# Patient Record
Sex: Female | Born: 1980
Health system: Southern US, Community
[De-identification: ages and names within clinical notes are randomized; demographics above are authoritative.]

## PROBLEM LIST (undated history)

## (undated) DIAGNOSIS — T7840XA Allergy, unspecified, initial encounter: Secondary | ICD-10-CM

## (undated) DIAGNOSIS — N809 Endometriosis, unspecified: Secondary | ICD-10-CM

## (undated) DIAGNOSIS — I10 Essential (primary) hypertension: Secondary | ICD-10-CM

## (undated) DIAGNOSIS — N80129 Deep endometriosis of ovary, unspecified ovary: Secondary | ICD-10-CM

## (undated) DIAGNOSIS — L0390 Cellulitis, unspecified: Secondary | ICD-10-CM

## (undated) HISTORY — DX: Essential (primary) hypertension: I10

## (undated) HISTORY — DX: Allergy, unspecified, initial encounter: T78.40XA

## (undated) HISTORY — DX: Endometriosis, unspecified: N80.9

## (undated) HISTORY — PX: WISDOM TOOTH EXTRACTION: SHX21

## (undated) HISTORY — DX: Deep endometriosis of ovary, unspecified ovary: N80.129

## (undated) HISTORY — DX: Cellulitis, unspecified: L03.90

---

## 2011-01-09 LAB — US OB COMP + 14 WK

## 2013-10-11 LAB — HM PAP SMEAR: HM PAP: NEGATIVE

## 2014-10-16 ENCOUNTER — Ambulatory Visit (INDEPENDENT_AMBULATORY_CARE_PROVIDER_SITE_OTHER): Payer: BC Managed Care – PPO | Admitting: Obstetrics & Gynecology

## 2014-10-16 ENCOUNTER — Other Ambulatory Visit (HOSPITAL_COMMUNITY)
Admission: RE | Admit: 2014-10-16 | Discharge: 2014-10-16 | Disposition: A | Discharge status: Home / Self Care | Payer: BC Managed Care – PPO | Source: Ambulatory Visit | Attending: Obstetrics & Gynecology | Admitting: Obstetrics & Gynecology

## 2014-10-16 ENCOUNTER — Encounter: Payer: Self-pay | Admitting: *Deleted

## 2014-10-16 ENCOUNTER — Encounter: Payer: Self-pay | Admitting: Obstetrics & Gynecology

## 2014-10-16 VITALS — BP 110/80 | Ht 69.0 in | Wt 248.0 lb

## 2014-10-16 DIAGNOSIS — Z01419 Encounter for gynecological examination (general) (routine) without abnormal findings: Secondary | ICD-10-CM | POA: Diagnosis present

## 2014-10-16 DIAGNOSIS — R87610 Atypical squamous cells of undetermined significance on cytologic smear of cervix (ASC-US): Secondary | ICD-10-CM

## 2014-10-16 DIAGNOSIS — E669 Obesity, unspecified: Secondary | ICD-10-CM | POA: Insufficient documentation

## 2014-10-16 DIAGNOSIS — Z1151 Encounter for screening for human papillomavirus (HPV): Secondary | ICD-10-CM | POA: Insufficient documentation

## 2014-10-16 DIAGNOSIS — R87619 Unspecified abnormal cytological findings in specimens from cervix uteri: Secondary | ICD-10-CM | POA: Insufficient documentation

## 2014-10-16 NOTE — Progress Notes (Signed)
Patient ID: Sonya Chapman, female   DOB: 09/17/1981, 33 y.o.   MRN: [id number] Subjective:     Sonya Chapman is a 33 y.o. female here for a routine exam.  Patient's last menstrual period was 09/15/2014. No obstetric history on file. Birth Control Method:  None, does not want or need Menstrual Calendar(currently): regular  Current complaints: none.   Current acute medical issues:  none   Recent Gynecologic History Patient's last menstrual period was 09/15/2014. Last Pap: 2014,  normal Last mammogram: ,    History reviewed. No pertinent past medical history.  Past Surgical History  Procedure Laterality Date  . Wisdom tooth extraction      OB History    No data available      History   Social History  . Marital Status: Married    Spouse Name: N/A    Number of Children: N/A  . Years of Education: N/A   Social History Main Topics  . Smoking status: Never Smoker   . Smokeless tobacco: None  . Alcohol Use: None  . Drug Use: None  . Sexual Activity: Yes   Other Topics Concern  . None   Social History Narrative  . None    Family History  Problem Relation Age of Onset  . Hypertension Mother     Current outpatient prescriptions: Multiple Vitamin (MULTIVITAMIN) tablet, Take 1 tablet by mouth daily., Disp: , Rfl:   Review of Systems  Review of Systems  Constitutional: Negative for fever, chills, weight loss, malaise/fatigue and diaphoresis.  HENT: Negative for hearing loss, ear pain, nosebleeds, congestion, sore throat, neck pain, tinnitus and ear discharge.   Eyes: Negative for blurred vision, double vision, photophobia, pain, discharge and redness.  Respiratory: Negative for cough, hemoptysis, sputum production, shortness of breath, wheezing and stridor.   Cardiovascular: Negative for chest pain, palpitations, orthopnea, claudication, leg swelling and PND.  Gastrointestinal: negative for abdominal pain. Negative for heartburn, nausea, vomiting, diarrhea,  constipation, blood in stool and melena.  Genitourinary: Negative for dysuria, urgency, frequency, hematuria and flank pain.  Musculoskeletal: Negative for myalgias, back pain, joint pain and falls.  Skin: Negative for itching and rash.  Neurological: Negative for dizziness, tingling, tremors, sensory change, speech change, focal weakness, seizures, loss of consciousness, weakness and headaches.  Endo/Heme/Allergies: Negative for environmental allergies and polydipsia. Does not bruise/bleed easily.  Psychiatric/Behavioral: Negative for depression, suicidal ideas, hallucinations, memory loss and substance abuse. The patient is not nervous/anxious and does not have insomnia.        Objective:  Blood pressure 110/80, height 5\' 9"  (1.753 m), weight 248 lb (112.492 kg), last menstrual period 09/15/2014.   Physical Exam  Vitals reviewed. Constitutional: She is oriented to person, place, and time. She appears well-developed and well-nourished.  HENT:  Head: Normocephalic and atraumatic.        Right Ear: External ear normal.  Left Ear: External ear normal.  Nose: Nose normal.  Mouth/Throat: Oropharynx is clear and moist.  Eyes: Conjunctivae and EOM are normal. Pupils are equal, round, and reactive to light. Right eye exhibits no discharge. Left eye exhibits no discharge. No scleral icterus.  Neck: Normal range of motion. Neck supple. No tracheal deviation present. No thyromegaly present.  Cardiovascular: Normal rate, regular rhythm, normal heart sounds and intact distal pulses.  Exam reveals no gallop and no friction rub.   No murmur heard. Respiratory: Effort normal and breath sounds normal. No respiratory distress. She has no wheezes. She has no rales. She exhibits no tenderness.  GI: Soft. Bowel sounds are normal. She exhibits no distension and no mass. There is no tenderness. There is no rebound and no guarding.  Genitourinary:  Breasts no masses skin changes or nipple changes bilaterally       Vulva is normal without lesions Vagina is pink moist without discharge Cervix normal in appearance and pap is done Uterus is normal size shape and contour Adnexa is negative with normal sized ovaries   Musculoskeletal: Normal range of motion. She exhibits no edema and no tenderness.  Neurological: She is alert and oriented to person, place, and time. She has normal reflexes. She displays normal reflexes. No cranial nerve deficit. She exhibits normal muscle tone. Coordination normal.  Skin: Skin is warm and dry. No rash noted. No erythema. No pallor.  Psychiatric: She has a normal mood and affect. Her behavior is normal. Judgment and thought content normal.       Assessment:    Healthy female exam.    Plan:    Contraception: none. Follow up in: 1 year.

## 2014-10-17 LAB — CYTOLOGY - PAP

## 2015-09-25 ENCOUNTER — Ambulatory Visit (INDEPENDENT_AMBULATORY_CARE_PROVIDER_SITE_OTHER): Payer: BLUE CROSS/BLUE SHIELD | Admitting: Obstetrics & Gynecology

## 2015-09-25 ENCOUNTER — Encounter: Payer: Self-pay | Admitting: Obstetrics & Gynecology

## 2015-09-25 VITALS — BP 150/90 | HR 76 | Ht 69.0 in | Wt 247.0 lb

## 2015-09-25 DIAGNOSIS — N808 Other endometriosis: Secondary | ICD-10-CM

## 2015-09-25 DIAGNOSIS — N80C2 Endometriosis of the umbilicus: Secondary | ICD-10-CM

## 2015-10-11 ENCOUNTER — Telehealth: Payer: Self-pay | Admitting: *Deleted

## 2015-10-11 NOTE — Telephone Encounter (Signed)
Pt states having CT done on 10/15/2015 and Dr. Despina HiddenEure told her it would be without contrast. Pt states the hospital is calling her and telling her to pick up the contrast prior to the CT. Please advise.

## 2015-10-12 NOTE — Telephone Encounter (Signed)
I called pt myself and told her she does not need the oral contrast  No need to call pt back

## 2015-10-15 ENCOUNTER — Other Ambulatory Visit: Payer: Self-pay | Admitting: Obstetrics & Gynecology

## 2015-10-15 ENCOUNTER — Ambulatory Visit (HOSPITAL_COMMUNITY)
Admission: RE | Admit: 2015-10-15 | Discharge: 2015-10-15 | Disposition: A | Discharge status: Home / Self Care | Payer: BLUE CROSS/BLUE SHIELD | Source: Ambulatory Visit | Attending: Obstetrics & Gynecology | Admitting: Obstetrics & Gynecology

## 2015-10-15 DIAGNOSIS — R935 Abnormal findings on diagnostic imaging of other abdominal regions, including retroperitoneum: Secondary | ICD-10-CM | POA: Insufficient documentation

## 2015-10-15 DIAGNOSIS — N808 Other endometriosis: Secondary | ICD-10-CM

## 2015-10-15 DIAGNOSIS — R1905 Periumbilic swelling, mass or lump: Secondary | ICD-10-CM | POA: Diagnosis not present

## 2015-10-15 DIAGNOSIS — R1033 Periumbilical pain: Secondary | ICD-10-CM | POA: Diagnosis present

## 2015-10-15 DIAGNOSIS — N80C2 Endometriosis of the umbilicus: Secondary | ICD-10-CM

## 2015-10-15 MED ORDER — IOHEXOL 300 MG/ML  SOLN: 100.0000 mL | Freq: Once | INTRAMUSCULAR | Status: DC | PRN

## 2015-10-16 ENCOUNTER — Encounter: Payer: Self-pay | Admitting: Obstetrics & Gynecology

## 2015-10-21 NOTE — Progress Notes (Signed)
Patient ID: Sonya Chapman, female   DOB: 08/27/1981, 34 y.o.   MRN: [id number]      Chief Complaint  Patient presents with  . gyn visit    c/c pulling/ burning on left side of belly button.    Blood pressure 150/90, pulse 76, height 5\' 9"  (1.753 m), weight 247 lb (112.038 kg), last menstrual period 09/16/2015.  34 y.o. G1P0 Patient's last menstrual period was 09/16/2015.   Subjective Pt has increasing severity of perimenstrual pain just to the left of the umbilicus No Caesraean section history No NVD No fevers chills Definitively associated with menses by her history  She spoke of it last year but it was mild and just starting to occur Last 6 months much worse, heating pad helps  Objective Indurated tissue 2x2 cm to left of umbilicus  Abdomen also negative  Pertinent ROS No burning with urination, frequency or urgency No nausea, vomiting or diarrhea Nor fever chills or other constitutional symptoms   Labs or studies  Impression Diagnoses this Encounter::   ICD-9-CM ICD-10-CM   1. Endometriosis of umbilicus 617.8 N80.8 CANCELED: CT Abdomen W Contrast    Established relevant diagnosis(es):   Plan/Recommendations: No orders of the defined types were placed in this encounter.    Labs or Scans Ordered: No orders of the defined types were placed in this encounter.    Management:: CT scan to be done at time of menses  Follow up Based on CT      All questions were answered.

## 2015-11-01 ENCOUNTER — Other Ambulatory Visit: Payer: Self-pay | Admitting: Obstetrics & Gynecology

## 2015-11-02 ENCOUNTER — Ambulatory Visit (INDEPENDENT_AMBULATORY_CARE_PROVIDER_SITE_OTHER): Payer: BLUE CROSS/BLUE SHIELD | Admitting: Obstetrics & Gynecology

## 2015-11-02 ENCOUNTER — Other Ambulatory Visit (HOSPITAL_COMMUNITY)
Admission: RE | Admit: 2015-11-02 | Discharge: 2015-11-02 | Disposition: A | Discharge status: Home / Self Care | Payer: BLUE CROSS/BLUE SHIELD | Source: Ambulatory Visit | Attending: Obstetrics & Gynecology | Admitting: Obstetrics & Gynecology

## 2015-11-02 ENCOUNTER — Encounter: Payer: Self-pay | Admitting: Obstetrics & Gynecology

## 2015-11-02 VITALS — BP 130/80 | HR 76 | Ht 69.0 in | Wt 250.0 lb

## 2015-11-02 DIAGNOSIS — Z01419 Encounter for gynecological examination (general) (routine) without abnormal findings: Secondary | ICD-10-CM | POA: Diagnosis not present

## 2015-11-02 MED ORDER — MEGESTROL ACETATE 40 MG PO TABS: 40.0000 mg | ORAL_TABLET | Freq: Every day | ORAL | Status: DC

## 2015-11-02 NOTE — Progress Notes (Signed)
Patient ID: Sonya Chapman, female   DOB: 05/05/1981, 34 y.o.   MRN: [id number]    Subjective:     Sonya Chapman Chapman is a 34 y.o. female here for a routine exam.  Patient's last menstrual period was 10/14/2015. G1P0 Birth Control Method:  none Menstrual Calendar(currently): regular monthly  Current complaints: endometriosis of umbilicus.   Current acute medical issues:     Recent Gynecologic History Patient's last menstrual period was 10/14/2015. Last Pap: 2015,  normal Last mammogram: ,    History reviewed. No pertinent past medical history.  Past Surgical History  Procedure Laterality Date  . Wisdom tooth extraction    . Cesarean section      OB History    Gravida Para Term Preterm AB TAB SAB Ectopic Multiple Living   1               Obstetric Comments   IVF pregnancy,incision abscess      Social History   Social History  . Marital Status: Married    Spouse Name: N/A  . Number of Children: N/A  . Years of Education: N/A   Social History Main Topics  . Smoking status: Never Smoker   . Smokeless tobacco: None  . Alcohol Use: None  . Drug Use: None  . Sexual Activity: Yes   Other Topics Concern  . None   Social History Narrative    Family History  Problem Relation Age of Onset  . Hypertension Mother   . Thyroid disease Mother      Current outpatient prescriptions:  Marland Kitchen.  Multiple Vitamin (MULTIVITAMIN) tablet, Take 1 tablet by mouth daily., Disp: , Rfl:   Review of Systems  Review of Systems  Constitutional: Negative for fever, chills, weight loss, malaise/fatigue and diaphoresis.  HENT: Negative for hearing loss, ear pain, nosebleeds, congestion, sore throat, neck pain, tinnitus and ear discharge.   Eyes: Negative for blurred vision, double vision, photophobia, pain, discharge and redness.  Respiratory: Negative for cough, hemoptysis, sputum production, shortness of breath, wheezing and stridor.   Cardiovascular: Negative for chest pain, palpitations,  orthopnea, claudication, leg swelling and PND.  Gastrointestinal: negative for abdominal pain. Negative for heartburn, nausea, vomiting, diarrhea, constipation, blood in stool and melena.  Genitourinary: Negative for dysuria, urgency, frequency, hematuria and flank pain.  Musculoskeletal: Negative for myalgias, back pain, joint pain and falls.  Skin: Negative for itching and rash.  Neurological: Negative for dizziness, tingling, tremors, sensory change, speech change, focal weakness, seizures, loss of consciousness, weakness and headaches.  Endo/Heme/Allergies: Negative for environmental allergies and polydipsia. Does not bruise/bleed easily.  Psychiatric/Behavioral: Negative for depression, suicidal ideas, hallucinations, memory loss and substance abuse. The patient is not nervous/anxious and does not have insomnia.        Objective:  Blood pressure 130/80, pulse 76, height 5\' 9"  (1.753 m), weight 250 lb (113.399 kg), last menstrual period 10/14/2015.   Physical Exam  Vitals reviewed. Constitutional: She is oriented to person, place, and time. She appears well-developed and well-nourished.  HENT:  Head: Normocephalic and atraumatic.        Right Ear: External ear normal.  Left Ear: External ear normal.  Nose: Nose normal.  Mouth/Throat: Oropharynx is clear and moist.  Eyes: Conjunctivae and EOM are normal. Pupils are equal, round, and reactive to light. Right eye exhibits no discharge. Left eye exhibits no discharge. No scleral icterus.  Neck: Normal range of motion. Neck supple. No tracheal deviation present. No thyromegaly present.  Cardiovascular: Normal rate, regular rhythm,  normal heart sounds and intact distal pulses.  Exam reveals no gallop and no friction rub.   No murmur heard. Respiratory: Effort normal and breath sounds normal. No respiratory distress. She has no wheezes. She has no rales. She exhibits no tenderness.  GI: Soft. Bowel sounds are normal. She exhibits no  distension and no mass. There is no tenderness. There is no rebound and no guarding.   Area to left of umbilicus is tender and indurated feeling Genitourinary:  Breasts no masses skin changes or nipple changes bilaterally      Vulva is normal without lesions Vagina is pink moist without discharge Cervix normal in appearance and pap is done Uterus is normal size shape and contour Adnexa is negative with normal sized ovaries   Musculoskeletal: Normal range of motion. She exhibits no edema and no tenderness.  Neurological: She is alert and oriented to person, place, and time. She has normal reflexes. She displays normal reflexes. No cranial nerve deficit. She exhibits normal muscle tone. Coordination normal.  Skin: Skin is warm and dry. No rash noted. No erythema. No pallor.  Psychiatric: She has a normal mood and affect. Her behavior is normal. Judgment and thought content normal.       Assessment:    Healthy female exam.    Plan:    pre op in 6 weeks Megestrol 40 mg daily to try to tsuppress menses

## 2015-11-08 LAB — CYTOLOGY - PAP

## 2015-12-14 ENCOUNTER — Encounter: Payer: Self-pay | Admitting: Obstetrics & Gynecology

## 2015-12-14 ENCOUNTER — Encounter: Payer: BLUE CROSS/BLUE SHIELD | Admitting: Obstetrics & Gynecology

## 2015-12-14 ENCOUNTER — Ambulatory Visit (INDEPENDENT_AMBULATORY_CARE_PROVIDER_SITE_OTHER): Payer: BLUE CROSS/BLUE SHIELD | Admitting: Obstetrics & Gynecology

## 2015-12-14 VITALS — BP 128/90 | HR 70 | Wt 248.0 lb

## 2015-12-14 DIAGNOSIS — N808 Other endometriosis: Secondary | ICD-10-CM | POA: Diagnosis not present

## 2015-12-14 DIAGNOSIS — N80C2 Endometriosis of the umbilicus: Secondary | ICD-10-CM

## 2015-12-14 NOTE — Progress Notes (Signed)
Patient ID: Sonya Chapman, female   DOB: Oct 09, 1981, 35 y.o.   MRN: [id number] Preoperative History and Physical  Sonya Chapman is a 35 y.o. G1P0 with Patient's last menstrual period was 10/14/2015 (approximate). admitted for a resection of peri umbilical endometrioma.  Pt began to have episodic pain about 16 months or so ago, which has progressively worsened The pain occurs around her menses and a tender mass also arises at that time CT scan confirms exam My impression was this is an unusual presenttation of an endometrioma, pt has not had a laparoscopy I placed her on megestrol and pt had a definitive positive response with near resolution of pain and mass  As a result will resect the entire area, CT reveals confined to abdominal wall  PMH: History reviewed. No pertinent past medical history.  PSH:  Past Surgical History  Procedure Laterality Date  . Wisdom tooth extraction    . Cesarean section      POb/GynH:  OB History    Gravida Para Term Preterm AB TAB SAB Ectopic Multiple Living   1               Obstetric Comments   IVF pregnancy,incision abscess      SH:  Social History  Substance Use Topics  . Smoking status: Never Smoker   . Smokeless tobacco: None  . Alcohol Use: 0.0 oz/week    0 Standard drinks or equivalent per week     Comment: occassional    FH:  Family History  Problem Relation Age of Onset  . Hypertension Mother   . Thyroid disease Mother      Allergies:  Allergies  Allergen Reactions  . Penicillins Rash    Has patient had a PCN reaction causing immediate rash, facial/tongue/throat swelling, SOB or lightheadedness with hypotension: Yes Has patient had a PCN reaction causing severe rash involving mucus membranes or skin necrosis: No Has patient had a PCN reaction that required hospitalization No Has patient had a PCN reaction occurring within the last  10 years: No If all of the above answers are "NO", then may proceed with Cephalosporin use.     Medications:  Current facility-administered medications:  . bupivacaine liposome (EXPAREL) 1.3 % injection 266 mg, 20 mL, Infiltration, Once, Lazaro Arms, MD . gentamicin (GARAMYCIN) 420 mg, clindamycin (CLEOCIN) 900 mg in dextrose 5 % 100 mL IVPB, , Intravenous, On Call to OR, Lazaro Arms, MD . ketorolac (TORADOL) 30 MG/ML injection 30 mg, 30 mg, Intravenous, Once, Lazaro Arms, MD  Review of Systems:   Review of Systems  Constitutional: Negative for fever, chills, weight loss, malaise/fatigue and diaphoresis.  HENT: Negative for hearing loss, ear pain, nosebleeds, congestion, sore throat, neck pain, tinnitus and ear discharge.  Eyes: Negative for blurred vision, double vision, photophobia, pain, discharge and redness.  Respiratory: Negative for cough, hemoptysis, sputum production, shortness of breath, wheezing and stridor.  Cardiovascular: Negative for chest pain, palpitations, orthopnea, claudication, leg swelling and PND.  Gastrointestinal: Positive for abdominal pain. Negative for heartburn, nausea, vomiting, diarrhea, constipation, blood in stool and melena.  Genitourinary: Negative for dysuria, urgency, frequency, hematuria and flank pain.  Musculoskeletal: Negative for myalgias, back pain, joint pain and falls.  Skin: Negative for itching and rash.  Neurological: Negative for dizziness, tingling, tremors, sensory change, speech change, focal weakness, seizures, loss of consciousness, weakness and headaches.  Endo/Heme/Allergies: Negative for environmental allergies and polydipsia. Does not bruise/bleed easily.  Psychiatric/Behavioral: Negative for depression, suicidal ideas, hallucinations, memory  loss and substance abuse. The patient is not nervous/anxious and does not have insomnia.     PHYSICAL EXAM:  Blood pressure 136/79, pulse 104, temperature 99 F (37.2  C), temperature source Oral, resp. rate 24, height 5\' 9"  (1.753 m), weight 249 lb (112.946 kg), last menstrual period 10/14/2015, SpO2 100 %.   Vitals reviewed. Constitutional: She is oriented to person, place, and time. She appears well-developed and well-nourished.  HENT:  Head: Normocephalic and atraumatic.  Right Ear: External ear normal.  Left Ear: External ear normal.  Nose: Nose normal.  Mouth/Throat: Oropharynx is clear and moist.  Eyes: Conjunctivae and EOM are normal. Pupils are equal, round, and reactive to light. Right eye exhibits no discharge. Left eye exhibits no discharge. No scleral icterus.  Neck: Normal range of motion. Neck supple. No tracheal deviation present. No thyromegaly present.  Cardiovascular: Normal rate, regular rhythm, normal heart sounds and intact distal pulses. Exam reveals no gallop and no friction rub.  No murmur heard. Respiratory: Effort normal and breath sounds normal. No respiratory distress. She has no wheezes. She has no rales. She exhibits no tenderness.  GI: Soft. Bowel sounds are normal. She exhibits no distension and no mass. There is tenderness around the umbilicus much less as compared to exam say in November prior to her megestrol therapy. There is no rebound and no guarding.  Genitourinary:   Vulva is normal without lesions Vagina is pink moist without discharge Cervix normal in appearance and pap is normal Uterus is normal size, contour, position, consistency, mobility, non-tender Adnexa is negative with normal sized ovaries by sonogram  Musculoskeletal: Normal range of motion. She exhibits no edema and no tenderness.  Neurological: She is alert and oriented to person, place, and time. She has normal reflexes. She displays normal reflexes. No cranial nerve deficit. She exhibits normal muscle tone. Coordination normal.  Skin: Skin is warm and dry. No rash noted. No erythema. No pallor.  Psychiatric: She has a normal mood and  affect. Her behavior is normal. Judgment and thought content normal.    Labs: Results for orders placed or performed during the hospital encounter of 12/21/15 (from the past 336 hour(s))  CBC   Collection Time: 12/21/15 9:15 AM  Result Value Ref Range   WBC 6.9 4.0 - 10.5 K/uL   RBC 5.43 (H) 3.87 - 5.11 MIL/uL   Hemoglobin 15.8 (H) 12.0 - 15.0 g/dL   HCT 29.5 (H) 62.1 - 30.8 %   MCV 87.1 78.0 - 100.0 fL   MCH 29.1 26.0 - 34.0 pg   MCHC 33.4 30.0 - 36.0 g/dL   RDW 65.7 84.6 - 96.2 %   Platelets 189 150 - 400 K/uL  Comprehensive metabolic panel   Collection Time: 12/21/15 9:15 AM  Result Value Ref Range   Sodium 141 135 - 145 mmol/L   Potassium 4.3 3.5 - 5.1 mmol/L   Chloride 109 101 - 111 mmol/L   CO2 22 22 - 32 mmol/L   Glucose, Bld 88 65 - 99 mg/dL   BUN 13 6 - 20 mg/dL   Creatinine, Ser 9.52 0.44 - 1.00 mg/dL   Calcium 9.5 8.9 - 84.1 mg/dL   Total Protein 7.8 6.5 - 8.1 g/dL   Albumin 4.7 3.5 - 5.0 g/dL   AST 22 15 - 41 U/L   ALT 11 (L) 14 - 54 U/L   Alkaline Phosphatase 86 38 - 126 U/L   Total Bilirubin 0.6 0.3 - 1.2 mg/dL   GFR calc  non Af Amer >60 >60 mL/min   GFR calc Af Amer >60 >60 mL/min   Anion gap 10 5 - 15  hCG, quantitative, pregnancy   Collection Time: 12/21/15 9:15 AM  Result Value Ref Range   hCG, Beta Chain, Quant, S <1 <5 mIU/mL  Urinalysis, Routine w reflex microscopic (not at [facility])   Collection Time: 12/21/15 9:15 AM  Result Value Ref Range   Color, Urine YELLOW YELLOW   APPearance CLEAR CLEAR   Specific Gravity, Urine <1.005 (L) 1.005 - 1.030   pH 5.5 5.0 - 8.0   Glucose, UA NEGATIVE NEGATIVE mg/dL   Hgb urine dipstick LARGE (A) NEGATIVE   Bilirubin Urine NEGATIVE NEGATIVE   Ketones, ur NEGATIVE NEGATIVE mg/dL   Protein, ur NEGATIVE NEGATIVE mg/dL   Nitrite NEGATIVE  NEGATIVE   Leukocytes, UA NEGATIVE NEGATIVE  Urine microscopic-add on   Collection Time: 12/21/15 9:15 AM  Result Value Ref Range   Squamous Epithelial / LPF NONE SEEN NONE SEEN   WBC, UA NONE SEEN 0 - 5 WBC/hpf   RBC / HPF TOO NUMEROUS TO COUNT 0 - 5 RBC/hpf   Bacteria, UA RARE (A) NONE SEEN    EKG: No orders found for this or any previous visit.  Imaging Studies:  Imaging Results    No results found.      Assessment: Peri umbilical endometrioma  Plan: Resection of peri umbilical endometrioma       Santoria Chason H 12/14/2015 10:08 AM

## 2015-12-19 NOTE — Patient Instructions (Signed)
    Sonya Chapman  12/19/2015     @   Your procedure is scheduled on 12/26/2015.  Report to [facility] at 7:00 A.M.  Call this number if you have problems the morning of surgery:  [phone]   Remember:  Do not eat food or drink liquids after midnight.  Take these medicines the morning of surgery with A SIP OF WATER Megace   Do not wear jewelry, make-up or nail polish.  Do not wear lotions, powders, or perfumes.  You may wear deodorant.  Do not shave 48 hours prior to surgery.  Men may shave face and neck.  Do not bring valuables to the hospital.  [facility] is not responsible for any belongings or valuables.  Contacts, dentures or bridgework may not be worn into surgery.  Leave your suitcase in the car.  After surgery it may be brought to your room.  For patients admitted to the hospital, discharge time will be determined by your treatment team.  Patients discharged the day of surgery will not be allowed to drive home.    Please read over the following fact sheets that you were given. Surgical Site Infection Prevention and Anesthesia Post-op Instructions     PATIENT INSTRUCTIONS POST-ANESTHESIA  IMMEDIATELY FOLLOWING SURGERY:  Do not drive or operate machinery for the first twenty four hours after surgery.  Do not make any important decisions for twenty four hours after surgery or while taking narcotic pain medications or sedatives.  If you develop intractable nausea and vomiting or a severe headache please notify your doctor immediately.  FOLLOW-UP:  Please make an appointment with your surgeon as instructed. You do not need to follow up with anesthesia unless specifically instructed to do so.  WOUND CARE INSTRUCTIONS (if applicable):  Keep a dry clean dressing on the anesthesia/puncture wound site if there is drainage.  Once the wound has quit draining you may leave it open to air.  Generally you should leave the bandage intact for twenty four hours  unless there is drainage.  If the epidural site drains for more than 36-48 hours please call the anesthesia department.  QUESTIONS?:  Please feel free to call your physician or the hospital operator if you have any questions, and they will be happy to assist you.

## 2015-12-21 ENCOUNTER — Encounter (HOSPITAL_COMMUNITY): Payer: Self-pay

## 2015-12-21 ENCOUNTER — Encounter (HOSPITAL_COMMUNITY)
Admission: RE | Admit: 2015-12-21 | Discharge: 2015-12-21 | Disposition: A | Discharge status: Home / Self Care | Payer: BLUE CROSS/BLUE SHIELD | Source: Ambulatory Visit | Attending: Obstetrics & Gynecology | Admitting: Obstetrics & Gynecology

## 2015-12-21 DIAGNOSIS — Z79899 Other long term (current) drug therapy: Secondary | ICD-10-CM | POA: Insufficient documentation

## 2015-12-21 DIAGNOSIS — Z01812 Encounter for preprocedural laboratory examination: Secondary | ICD-10-CM | POA: Insufficient documentation

## 2015-12-21 DIAGNOSIS — Z9889 Other specified postprocedural states: Secondary | ICD-10-CM | POA: Insufficient documentation

## 2015-12-21 DIAGNOSIS — Z880 Allergy status to penicillin: Secondary | ICD-10-CM | POA: Insufficient documentation

## 2015-12-21 DIAGNOSIS — N808 Other endometriosis: Secondary | ICD-10-CM | POA: Diagnosis not present

## 2015-12-21 LAB — COMPREHENSIVE METABOLIC PANEL
ALT: 11 U/L — ABNORMAL LOW (ref 14–54)
AST: 22 U/L (ref 15–41)
Albumin: 4.7 g/dL (ref 3.5–5.0)
Alkaline Phosphatase: 86 U/L (ref 38–126)
Anion gap: 10 (ref 5–15)
BILIRUBIN TOTAL: 0.6 mg/dL (ref 0.3–1.2)
BUN: 13 mg/dL (ref 6–20)
CHLORIDE: 109 mmol/L (ref 101–111)
CO2: 22 mmol/L (ref 22–32)
CREATININE: 0.86 mg/dL (ref 0.44–1.00)
Calcium: 9.5 mg/dL (ref 8.9–10.3)
Glucose, Bld: 88 mg/dL (ref 65–99)
POTASSIUM: 4.3 mmol/L (ref 3.5–5.1)
Sodium: 141 mmol/L (ref 135–145)
TOTAL PROTEIN: 7.8 g/dL (ref 6.5–8.1)

## 2015-12-21 LAB — CBC
HCT: 47.3 % — ABNORMAL HIGH (ref 36.0–46.0)
Hemoglobin: 15.8 g/dL — ABNORMAL HIGH (ref 12.0–15.0)
MCH: 29.1 pg (ref 26.0–34.0)
MCHC: 33.4 g/dL (ref 30.0–36.0)
MCV: 87.1 fL (ref 78.0–100.0)
PLATELETS: 189 10*3/uL (ref 150–400)
RBC: 5.43 MIL/uL — ABNORMAL HIGH (ref 3.87–5.11)
RDW: 13.4 % (ref 11.5–15.5)
WBC: 6.9 10*3/uL (ref 4.0–10.5)

## 2015-12-21 LAB — URINALYSIS, ROUTINE W REFLEX MICROSCOPIC
Bilirubin Urine: NEGATIVE
GLUCOSE, UA: NEGATIVE mg/dL
Ketones, ur: NEGATIVE mg/dL
LEUKOCYTES UA: NEGATIVE
Nitrite: NEGATIVE
PH: 5.5 (ref 5.0–8.0)
Protein, ur: NEGATIVE mg/dL
Specific Gravity, Urine: <1.005 — ABNORMAL LOW (ref 1.005–1.030)

## 2015-12-21 LAB — URINE MICROSCOPIC-ADD ON
SQUAMOUS EPITHELIAL / LPF: NONE SEEN
WBC UA: NONE SEEN WBC/hpf (ref 0–5)

## 2015-12-21 LAB — HCG, QUANTITATIVE, PREGNANCY

## 2015-12-26 ENCOUNTER — Ambulatory Visit (HOSPITAL_COMMUNITY): Payer: BLUE CROSS/BLUE SHIELD | Admitting: Anesthesiology

## 2015-12-26 ENCOUNTER — Ambulatory Visit (HOSPITAL_COMMUNITY)
Admission: RE | Admit: 2015-12-26 | Discharge: 2015-12-26 | Disposition: A | Discharge status: Home / Self Care | Payer: BLUE CROSS/BLUE SHIELD | Source: Ambulatory Visit | Attending: Obstetrics & Gynecology | Admitting: Obstetrics & Gynecology

## 2015-12-26 ENCOUNTER — Encounter (HOSPITAL_COMMUNITY)
Admission: RE | Disposition: A | Discharge status: Home / Self Care | Payer: Self-pay | Source: Ambulatory Visit | Attending: Obstetrics & Gynecology

## 2015-12-26 ENCOUNTER — Encounter (HOSPITAL_COMMUNITY): Payer: Self-pay | Admitting: *Deleted

## 2015-12-26 DIAGNOSIS — Z880 Allergy status to penicillin: Secondary | ICD-10-CM | POA: Diagnosis not present

## 2015-12-26 DIAGNOSIS — N808 Other endometriosis: Secondary | ICD-10-CM | POA: Insufficient documentation

## 2015-12-26 DIAGNOSIS — N809 Endometriosis, unspecified: Secondary | ICD-10-CM | POA: Diagnosis not present

## 2015-12-26 HISTORY — PX: EXCISION OF ENDOMETRIOMA: SHX6473

## 2015-12-26 SURGERY — EXCISION, ENDOMETRIOMA
Anesthesia: General

## 2015-12-26 MED ORDER — BUPIVACAINE LIPOSOME 1.3 % IJ SUSP
INTRAMUSCULAR | Status: AC
Start: 2015-12-26 — End: 2015-12-26
  Filled 2015-12-26: qty 20

## 2015-12-26 MED ORDER — PROPOFOL 10 MG/ML IV BOLUS
INTRAVENOUS | Status: DC | PRN
  Administered 2015-12-26: 160 mg via INTRAVENOUS

## 2015-12-26 MED ORDER — GLYCOPYRROLATE 0.2 MG/ML IJ SOLN
INTRAMUSCULAR | Status: AC
Start: 2015-12-26 — End: 2015-12-26
  Filled 2015-12-26: qty 2

## 2015-12-26 MED ORDER — FENTANYL CITRATE (PF) 250 MCG/5ML IJ SOLN
INTRAMUSCULAR | Status: AC
Start: 2015-12-26 — End: 2015-12-26
  Filled 2015-12-26: qty 5

## 2015-12-26 MED ORDER — NEOSTIGMINE METHYLSULFATE 10 MG/10ML IV SOLN
INTRAVENOUS | Status: DC | PRN
  Administered 2015-12-26: 3 mg via INTRAVENOUS

## 2015-12-26 MED ORDER — KETOROLAC TROMETHAMINE 30 MG/ML IJ SOLN
30.0000 mg | Freq: Once | INTRAMUSCULAR | Status: AC
  Administered 2015-12-26: 30 mg via INTRAVENOUS
  Filled 2015-12-26: qty 1

## 2015-12-26 MED ORDER — 0.9 % SODIUM CHLORIDE (POUR BTL) OPTIME
TOPICAL | Status: DC | PRN
  Administered 2015-12-26: 1000 mL

## 2015-12-26 MED ORDER — ONDANSETRON HCL 4 MG/2ML IJ SOLN
INTRAMUSCULAR | Status: AC
  Filled 2015-12-26: qty 2

## 2015-12-26 MED ORDER — KETOROLAC TROMETHAMINE 10 MG PO TABS: 10.0000 mg | ORAL_TABLET | Freq: Three times a day (TID) | ORAL | Status: DC | PRN

## 2015-12-26 MED ORDER — LIDOCAINE HCL 1 % IJ SOLN
INTRAMUSCULAR | Status: DC | PRN
  Administered 2015-12-26: 40 mg via INTRADERMAL

## 2015-12-26 MED ORDER — FENTANYL CITRATE (PF) 250 MCG/5ML IJ SOLN
INTRAMUSCULAR | Status: DC | PRN
  Administered 2015-12-26 (×4): 50 ug via INTRAVENOUS

## 2015-12-26 MED ORDER — FENTANYL CITRATE (PF) 100 MCG/2ML IJ SOLN
25.0000 ug | INTRAMUSCULAR | Status: DC | PRN
  Administered 2015-12-26: 25 ug via INTRAVENOUS
  Filled 2015-12-26: qty 2

## 2015-12-26 MED ORDER — ONDANSETRON HCL 4 MG/2ML IJ SOLN
4.0000 mg | Freq: Once | INTRAMUSCULAR | Status: AC
  Administered 2015-12-26: 4 mg via INTRAVENOUS

## 2015-12-26 MED ORDER — ROCURONIUM BROMIDE 50 MG/5ML IV SOLN
INTRAVENOUS | Status: AC
  Filled 2015-12-26: qty 2

## 2015-12-26 MED ORDER — ONDANSETRON HCL 4 MG/2ML IJ SOLN: 4.0000 mg | Freq: Once | INTRAMUSCULAR | Status: DC | PRN

## 2015-12-26 MED ORDER — PROPOFOL 10 MG/ML IV BOLUS
INTRAVENOUS | Status: AC
  Filled 2015-12-26: qty 20

## 2015-12-26 MED ORDER — LIDOCAINE HCL (PF) 1 % IJ SOLN
INTRAMUSCULAR | Status: AC
  Filled 2015-12-26: qty 5

## 2015-12-26 MED ORDER — ROCURONIUM BROMIDE 100 MG/10ML IV SOLN
INTRAVENOUS | Status: DC | PRN
  Administered 2015-12-26: 5 mg via INTRAVENOUS

## 2015-12-26 MED ORDER — MIDAZOLAM HCL 2 MG/2ML IJ SOLN
1.0000 mg | INTRAMUSCULAR | Status: DC | PRN
  Administered 2015-12-26 (×2): 2 mg via INTRAVENOUS
  Filled 2015-12-26: qty 2

## 2015-12-26 MED ORDER — GENTAMICIN SULFATE 40 MG/ML IJ SOLN
INTRAVENOUS | Status: AC
  Administered 2015-12-26: 100 mL via INTRAVENOUS
  Filled 2015-12-26: qty 10.5

## 2015-12-26 MED ORDER — HYDROCODONE-ACETAMINOPHEN 5-325 MG PO TABS: 1.0000 | ORAL_TABLET | Freq: Four times a day (QID) | ORAL | Status: DC | PRN

## 2015-12-26 MED ORDER — SILVER SULFADIAZINE 1 % EX CREA: TOPICAL_CREAM | CUTANEOUS | Status: DC

## 2015-12-26 MED ORDER — MIDAZOLAM HCL 2 MG/2ML IJ SOLN
INTRAMUSCULAR | Status: AC
  Filled 2015-12-26: qty 2

## 2015-12-26 MED ORDER — BUPIVACAINE LIPOSOME 1.3 % IJ SUSP
20.0000 mL | Freq: Once | INTRAMUSCULAR | Status: DC
  Filled 2015-12-26: qty 20

## 2015-12-26 MED ORDER — LACTATED RINGERS IV SOLN
INTRAVENOUS | Status: DC
  Administered 2015-12-26: 1000 mL via INTRAVENOUS
  Administered 2015-12-26: 10:00:00 via INTRAVENOUS

## 2015-12-26 MED ORDER — NEOSTIGMINE METHYLSULFATE 10 MG/10ML IV SOLN
INTRAVENOUS | Status: AC
  Filled 2015-12-26: qty 1

## 2015-12-26 MED ORDER — ONDANSETRON HCL 8 MG PO TABS: 8.0000 mg | ORAL_TABLET | Freq: Three times a day (TID) | ORAL | Status: DC | PRN

## 2015-12-26 MED ORDER — BUPIVACAINE LIPOSOME 1.3 % IJ SUSP
INTRAMUSCULAR | Status: DC | PRN
  Administered 2015-12-26: 20 mL

## 2015-12-26 MED ORDER — GLYCOPYRROLATE 0.2 MG/ML IJ SOLN
INTRAMUSCULAR | Status: DC | PRN
  Administered 2015-12-26: .5 mg via INTRAVENOUS

## 2015-12-26 SURGICAL SUPPLY — 34 items
BAG HAMPER (MISCELLANEOUS) ×3 IMPLANT
CLOTH BEACON ORANGE TIMEOUT ST (SAFETY) ×3 IMPLANT
COVER LIGHT HANDLE STERIS (MISCELLANEOUS) ×6 IMPLANT
ELECT REM PT RETURN 9FT ADLT (ELECTROSURGICAL) ×3
ELECTRODE REM PT RTRN 9FT ADLT (ELECTROSURGICAL) ×2 IMPLANT
FORMALIN 10 PREFIL 480ML (MISCELLANEOUS) ×3 IMPLANT
GAUZE SPONGE 4X4 12PLY STRL (GAUZE/BANDAGES/DRESSINGS) ×3 IMPLANT
GLOVE BIOGEL M 7.0 STRL (GLOVE) ×3 IMPLANT
GLOVE BIOGEL PI IND STRL 7.0 (GLOVE) ×4 IMPLANT
GLOVE BIOGEL PI IND STRL 8 (GLOVE) ×2 IMPLANT
GLOVE BIOGEL PI INDICATOR 7.0 (GLOVE) ×2
GLOVE BIOGEL PI INDICATOR 8 (GLOVE) ×1
GLOVE ECLIPSE 6.5 STRL STRAW (GLOVE) ×3 IMPLANT
GLOVE ECLIPSE 8.0 STRL XLNG CF (GLOVE) ×3 IMPLANT
GOWN STRL REUS W/TWL LRG LVL3 (GOWN DISPOSABLE) ×6 IMPLANT
GOWN STRL REUS W/TWL XL LVL3 (GOWN DISPOSABLE) ×3 IMPLANT
INST SET MAJOR GENERAL (KITS) ×3 IMPLANT
KIT ROOM TURNOVER APOR (KITS) ×3 IMPLANT
LIQUID BAND (GAUZE/BANDAGES/DRESSINGS) IMPLANT
MANIFOLD NEPTUNE II (INSTRUMENTS) ×3 IMPLANT
NEEDLE HYPO 18GX1.5 BLUNT FILL (NEEDLE) ×3 IMPLANT
NEEDLE HYPO 21X1.5 SAFETY (NEEDLE) ×3 IMPLANT
NS IRRIG 1000ML POUR BTL (IV SOLUTION) ×6 IMPLANT
PACK ABDOMINAL MAJOR (CUSTOM PROCEDURE TRAY) ×3 IMPLANT
PAD ARMBOARD 7.5X6 YLW CONV (MISCELLANEOUS) ×3 IMPLANT
SET BASIN LINEN APH (SET/KITS/TRAYS/PACK) ×3 IMPLANT
STAPLER VISISTAT 35W (STAPLE) ×3 IMPLANT
SUT MNCRL+ AB 3-0 CT1 36 (SUTURE) ×2 IMPLANT
SUT MONOCRYL AB 3-0 CT1 36IN (SUTURE) ×1
SUT VIC AB 0 CT1 27 (SUTURE) ×1
SUT VIC AB 0 CT1 27XCR 8 STRN (SUTURE) ×2 IMPLANT
SUT VICRYL 3 0 (SUTURE) ×3 IMPLANT
SYRINGE 20CC LL (MISCELLANEOUS) ×3 IMPLANT
TAPE CLOTH SURG 4X10 WHT LF (GAUZE/BANDAGES/DRESSINGS) ×3 IMPLANT

## 2015-12-26 NOTE — Transfer of Care (Signed)
Immediate Anesthesia Transfer of Care Note  Patient: Sonya Chapman  Procedure(s) Performed: Procedure(s): RESECTION OF PERIUMBILICAL ENDOMETRIOMA  Patient Location: PACU  Anesthesia Type:General  Level of Consciousness: awake  Airway & Oxygen Therapy: Patient Spontanous Breathing and Patient connected to face mask oxygen  Post-op Assessment: Report given to RN  Post vital signs: Reviewed and stable  Last Vitals:  Filed Vitals:   12/26/15 0825 12/26/15 0830  BP: 131/79 138/80  Pulse:    Temp:    Resp: 17 27    Complications: No apparent anesthesia complications

## 2015-12-26 NOTE — Discharge Instructions (Signed)
Umbilical Herniorrhaphy, Care After Not a herniorrhaphy but the instructions are the same, closest I could get  Refer to this sheet in the next few weeks. These instructions provide you with information on caring for yourself after your procedure. Your health care provider may also give you more specific instructions. Your treatment has been planned according to current medical practices, but problems sometimes occur. Call your health care provider if you have any problems or questions after your procedure. HOME CARE INSTRUCTIONS  If you are given antibiotic medicine, take it as directed. Finish it even if you start to feel better.  Only take over-the-counter or prescription medicines for pain, fever, or discomfort as directed by your health care provider. Do not take aspirin. It can cause bleeding.  Do not get your surgical cut (incision) area wet unless your health care provider says it is okay.  Avoid lifting objects heavier than 10 lb (4.5 kg) for 8 weeks after surgery.  Avoid sexual activity for 5 weeks after surgery or as directed by your health care provider.  Do not drive while taking prescription pain medicine.  You may return to your other normal, daily activities after 3 days or as directed by your health care provider. SEEK MEDICAL CARE IF:  You notice blood or fluid leaking from the surgical site.  Your incision area becomes red or swollen.  Your pain at the surgical site becomes worse or is not relieved by medicine.  You have problems urinating.  You feel nauseous or vomit more than 2 days after surgery.  You notice the bulge in your abdomen returns after the procedure. SEEK IMMEDIATE MEDICAL CARE IF:  You have a fever.  You have nausea or vomiting that will not stop.   This information is not intended to replace advice given to you by your health care provider. Make sure you discuss any questions you have with your health care provider.   Document Released:  04/20/2012 Document Revised: 11/10/2014 Document Reviewed: 04/20/2012 Elsevier Interactive Patient Education [employer]

## 2015-12-26 NOTE — Op Note (Signed)
Preoperative diagnosis:  Periumbilical endometrioma  Postoperative diagnosis:  Same as above  Procedure:  Exploration of the abdominal wall with resection of area umbilical endometrioma  Surgeon:  Lazaro Arms, MD  Anesthesia:  LMA  Findings:  I originally saw the patient fall of 2015 and she stated that she was having just a little bit of recent episodic periumbilical pain just to the left of the umbilicus.  At the time the exam was benign and it was felt as if this probably represented a musculoskeletal issue.  However the cane patient came back 11 months later stating she had had a significant increase in her pain that it was related to her timing of her menses and at that time she could feel a mass.  On exam this time there was indeed a palpable mass a little small than a golf ball which was affixed to the fascia and muscle it felt like.  CT scan revealed a mass confined to the abdominal wall consistent with the exam.  No intraperitoneal involvement.  I conclusion is this was a endometrioma and I placed the patient on megestrol for suppression and she responded quite nicely to the hormonal suppression.  In fact the mass that was palpable went completely away.  However was not reasonable that we could continue her on megestrol and definitely as she wanted to have another child so she is brought to the OR for exploration and removal of the endometrioma.  Intraoperatively I could feel induration of the subcutaneous fat going all the way down to the fascia and involving some superficial rectus muscle.  I completely resected all of the indurated tissue that was palpable and got it back to soft pliable tissue presumably then free of endometrioma and endometriosis microscopically.  Description of operation:  Patient was taken to the operating room where she was placed in the supine position and underwent an LMA anesthesia.  She was prepped and draped in the usual sterile fashion for periumbilical  procedure.  An elliptical incision was made to the left of the umbilicus which allowed dissection down to the abdominal wall just to the left of the umbilicus.  The electrocautery unit was used to resect involved subcutaneous fat abdominal wall fascia and rectus muscle which was involved with the endometriosis.  Resected all tissue which was felt to be indurated and could possibly be microscopically involved with the endometriosis.  I then closed the fascial defect with interrupted Vicryl sutures.  The subcutaneous space was closed in 2 layers with interrupted Vicryl and Monocryl suture.  IMA stasis was achieved with electrocautery unit and also closing of the space with suture as detailed above.  The skin was closed using skin staples.  20 cc of exparel, 266 mg total, was injected into the subcutaneous space around the incision down to the abdominal wall.  The patient tolerated the procedure well.  She experienced about 50 cc of blood loss.  She was taken to the recovery room good stable condition all counts were correct.  She received gentamicin and clindamycin preoperatively.  She also received Toradol prophylactically preoperatively.  She'll be seen back in the office in 1 week for postop evaluation  Lazaro Arms, MD 12/26/2015 10:00 AM

## 2015-12-26 NOTE — Anesthesia Postprocedure Evaluation (Signed)
Anesthesia Post Note  Patient: Sonya Chapman  Procedure(s) Performed: Procedure(s): RESECTION OF PERIUMBILICAL ENDOMETRIOMA  Patient location during evaluation: PACU Anesthesia Type: General Level of consciousness: awake and alert Pain management: pain level controlled Vital Signs Assessment: post-procedure vital signs reviewed and stable Respiratory status: spontaneous breathing Cardiovascular status: blood pressure returned to baseline Postop Assessment: no signs of nausea or vomiting Anesthetic complications: no    Last Vitals:  Filed Vitals:   12/26/15 1030 12/26/15 1041  BP: 117/61 115/72  Pulse: 66 67  Temp: 36.4 C 36.6 C  Resp: 12 12    Last Pain:  Filed Vitals:   12/26/15 1047  PainSc: 5                  Heitor Steinhoff

## 2015-12-26 NOTE — Anesthesia Preprocedure Evaluation (Signed)
Anesthesia Evaluation  Patient identified by MRN, date of birth, ID band Patient awake    Reviewed: Allergy & Precautions, NPO status , Patient's Chart, lab work & pertinent test results  Airway Mallampati: I  TM Distance: >3 FB     Dental  (+) Teeth Intact, Dental Advisory Given   Pulmonary neg pulmonary ROS,  breath sounds clear to auscultation        Cardiovascular negative cardio ROS  Rhythm:Regular Rate:Normal     Neuro/Psych    GI/Hepatic negative GI ROS,   Endo/Other    Renal/GU      Musculoskeletal   Abdominal   Peds  Hematology   Anesthesia Other Findings   Reproductive/Obstetrics                             Anesthesia Physical Anesthesia Plan  ASA: I  Anesthesia Plan: General   Post-op Pain Management:    Induction: Intravenous  Airway Management Planned: Oral ETT  Additional Equipment:   Intra-op Plan:   Post-operative Plan: Extubation in OR  Informed Consent: I have reviewed the patients History and Physical, chart, labs and discussed the procedure including the risks, benefits and alternatives for the proposed anesthesia with the patient or authorized representative who has indicated his/her understanding and acceptance.     Plan Discussed with:   Anesthesia Plan Comments:         Anesthesia Quick Evaluation  

## 2015-12-26 NOTE — Anesthesia Procedure Notes (Signed)
Procedure Name: Intubation Date/Time: 12/26/2015 8:43 AM Performed by: Glynn Octave E Pre-anesthesia Checklist: Patient identified, Patient being monitored, Timeout performed, Emergency Drugs available and Suction available Patient Re-evaluated:Patient Re-evaluated prior to inductionOxygen Delivery Method: Circle System Utilized Preoxygenation: Pre-oxygenation with 100% oxygen Intubation Type: IV induction Ventilation: Mask ventilation without difficulty Laryngoscope Size: Mac and 3 Grade View: Grade I Tube type: Oral Tube size: 7.0 mm Number of attempts: 1 Airway Equipment and Method: Stylet Placement Confirmation: ETT inserted through vocal cords under direct vision,  positive ETCO2 and breath sounds checked- equal and bilateral Secured at: 21 cm Tube secured with: Tape Dental Injury: Teeth and Oropharynx as per pre-operative assessment

## 2015-12-26 NOTE — H&P (Signed)
Preoperative History and Physical  Sonya Chapman is a 35 y.o. G1P0 with Patient's last menstrual period was 10/14/2015 (approximate). admitted for a resection of peri umbilical endometrioma.  Pt began to have episodic pain about 16 months or so ago, which has progressively worsened The pain occurs around her menses and a tender mass also arises at that time CT scan confirms exam My impression was this is an unusual presenttation of an endometrioma, pt has not had a laparoscopy I placed her on megestrol and pt had a definitive positive response with near resolution of pain and mass  As a result will resect the entire area, CT reveals confined to abdominal wall  PMH:   History reviewed. No pertinent past medical history.  PSH:     Past Surgical History  Procedure Laterality Date  . Wisdom tooth extraction    . Cesarean section      POb/GynH:      OB History    Gravida Para Term Preterm AB TAB SAB Ectopic Multiple Living   1               Obstetric Comments   IVF pregnancy,incision abscess      SH:   Social History  Substance Use Topics  . Smoking status: Never Smoker   . Smokeless tobacco: None  . Alcohol Use: 0.0 oz/week    0 Standard drinks or equivalent per week     Comment: occassional    FH:    Family History  Problem Relation Age of Onset  . Hypertension Mother   . Thyroid disease Mother      Allergies:  Allergies  Allergen Reactions  . Penicillins Rash    Has patient had a PCN reaction causing immediate rash, facial/tongue/throat swelling, SOB or lightheadedness with hypotension: Yes Has patient had a PCN reaction causing severe rash involving mucus membranes or skin necrosis: No Has patient had a PCN reaction that required hospitalization No Has patient had a PCN reaction occurring within the last 10 years: No If all of the above answers are "NO", then may proceed with Cephalosporin use.     Medications:       Current facility-administered  medications:  .  bupivacaine liposome (EXPAREL) 1.3 % injection 266 mg, 20 mL, Infiltration, Once, Lazaro Arms, MD .  gentamicin (GARAMYCIN) 420 mg, clindamycin (CLEOCIN) 900 mg in dextrose 5 % 100 mL IVPB, , Intravenous, On Call to OR, Lazaro Arms, MD .  ketorolac (TORADOL) 30 MG/ML injection 30 mg, 30 mg, Intravenous, Once, Lazaro Arms, MD  Review of Systems:   Review of Systems  Constitutional: Negative for fever, chills, weight loss, malaise/fatigue and diaphoresis.  HENT: Negative for hearing loss, ear pain, nosebleeds, congestion, sore throat, neck pain, tinnitus and ear discharge.   Eyes: Negative for blurred vision, double vision, photophobia, pain, discharge and redness.  Respiratory: Negative for cough, hemoptysis, sputum production, shortness of breath, wheezing and stridor.   Cardiovascular: Negative for chest pain, palpitations, orthopnea, claudication, leg swelling and PND.  Gastrointestinal: Positive for abdominal pain. Negative for heartburn, nausea, vomiting, diarrhea, constipation, blood in stool and melena.  Genitourinary: Negative for dysuria, urgency, frequency, hematuria and flank pain.  Musculoskeletal: Negative for myalgias, back pain, joint pain and falls.  Skin: Negative for itching and rash.  Neurological: Negative for dizziness, tingling, tremors, sensory change, speech change, focal weakness, seizures, loss of consciousness, weakness and headaches.  Endo/Heme/Allergies: Negative for environmental allergies and polydipsia. Does not bruise/bleed easily.  Psychiatric/Behavioral:  Negative for depression, suicidal ideas, hallucinations, memory loss and substance abuse. The patient is not nervous/anxious and does not have insomnia.      PHYSICAL EXAM:  Blood pressure 136/79, pulse 104, temperature 99 F (37.2 C), temperature source Oral, resp. rate 24, height  (1.753 m), weight 249 lb (112.946 kg), last menstrual period 10/14/2015, SpO2 100 %.    Vitals  reviewed. Constitutional: She is oriented to person, place, and time. She appears well-developed and well-nourished.  HENT:  Head: Normocephalic and atraumatic.  Right Ear: External ear normal.  Left Ear: External ear normal.  Nose: Nose normal.  Mouth/Throat: Oropharynx is clear and moist.  Eyes: Conjunctivae and EOM are normal. Pupils are equal, round, and reactive to light. Right eye exhibits no discharge. Left eye exhibits no discharge. No scleral icterus.  Neck: Normal range of motion. Neck supple. No tracheal deviation present. No thyromegaly present.  Cardiovascular: Normal rate, regular rhythm, normal heart sounds and intact distal pulses.  Exam reveals no gallop and no friction rub.   No murmur heard. Respiratory: Effort normal and breath sounds normal. No respiratory distress. She has no wheezes. She has no rales. She exhibits no tenderness.  GI: Soft. Bowel sounds are normal. She exhibits no distension and no mass. There is tenderness around the umbilicus much less as compared to exam say in November prior to her megestrol therapy. There is no rebound and no guarding.  Genitourinary:       Vulva is normal without lesions Vagina is pink moist without discharge Cervix normal in appearance and pap is normal Uterus is normal size, contour, position, consistency, mobility, non-tender Adnexa is negative with normal sized ovaries by sonogram  Musculoskeletal: Normal range of motion. She exhibits no edema and no tenderness.  Neurological: She is alert and oriented to person, place, and time. She has normal reflexes. She displays normal reflexes. No cranial nerve deficit. She exhibits normal muscle tone. Coordination normal.  Skin: Skin is warm and dry. No rash noted. No erythema. No pallor.  Psychiatric: She has a normal mood and affect. Her behavior is normal. Judgment and thought content normal.    Labs: Results for orders placed or performed during the hospital encounter of 12/21/15  (from the past 336 hour(s))  CBC   Collection Time: 12/21/15  9:15 AM  Result Value Ref Range   WBC 6.9 4.0 - 10.5 K/uL   RBC 5.43 (H) 3.87 - 5.11 MIL/uL   Hemoglobin 15.8 (H) 12.0 - 15.0 g/dL   HCT 40.9 (H) 81.1 - 91.4 %   MCV 87.1 78.0 - 100.0 fL   MCH 29.1 26.0 - 34.0 pg   MCHC 33.4 30.0 - 36.0 g/dL   RDW 78.2 95.6 - 21.3 %   Platelets 189 150 - 400 K/uL  Comprehensive metabolic panel   Collection Time: 12/21/15  9:15 AM  Result Value Ref Range   Sodium 141 135 - 145 mmol/L   Potassium 4.3 3.5 - 5.1 mmol/L   Chloride 109 101 - 111 mmol/L   CO2 22 22 - 32 mmol/L   Glucose, Bld 88 65 - 99 mg/dL   BUN 13 6 - 20 mg/dL   Creatinine, Ser 0.86 0.44 - 1.00 mg/dL   Calcium 9.5 8.9 - 57.8 mg/dL   Total Protein 7.8 6.5 - 8.1 g/dL   Albumin 4.7 3.5 - 5.0 g/dL   AST 22 15 - 41 U/L   ALT 11 (L) 14 - 54 U/L   Alkaline Phosphatase 86  38 - 126 U/L   Total Bilirubin 0.6 0.3 - 1.2 mg/dL   GFR calc non Af Amer >60 >60 mL/min   GFR calc Af Amer >60 >60 mL/min   Anion gap 10 5 - 15  hCG, quantitative, pregnancy   Collection Time: 12/21/15  9:15 AM  Result Value Ref Range   hCG, Beta Chain, Quant, S <1 <5 mIU/mL  Urinalysis, Routine w reflex microscopic (not at [facility])   Collection Time: 12/21/15  9:15 AM  Result Value Ref Range   Color, Urine YELLOW YELLOW   APPearance CLEAR CLEAR   Specific Gravity, Urine <1.005 (L) 1.005 - 1.030   pH 5.5 5.0 - 8.0   Glucose, UA NEGATIVE NEGATIVE mg/dL   Hgb urine dipstick LARGE (A) NEGATIVE   Bilirubin Urine NEGATIVE NEGATIVE   Ketones, ur NEGATIVE NEGATIVE mg/dL   Protein, ur NEGATIVE NEGATIVE mg/dL   Nitrite NEGATIVE NEGATIVE   Leukocytes, UA NEGATIVE NEGATIVE  Urine microscopic-add on   Collection Time: 12/21/15  9:15 AM  Result Value Ref Range   Squamous Epithelial / LPF NONE SEEN NONE SEEN   WBC, UA NONE SEEN 0 - 5 WBC/hpf   RBC / HPF TOO NUMEROUS TO COUNT 0 - 5 RBC/hpf   Bacteria, UA RARE (A) NONE SEEN    EKG: No orders found for this  or any previous visit.  Imaging Studies: No results found.    Assessment: Peri umbilical endometrioma  Plan: Resection of peri umbilical endometrioma  Sonya Chapman H 12/26/2015 8:07 AM

## 2015-12-27 ENCOUNTER — Telehealth: Payer: Self-pay | Admitting: *Deleted

## 2015-12-27 ENCOUNTER — Encounter (HOSPITAL_COMMUNITY): Payer: Self-pay | Admitting: Obstetrics & Gynecology

## 2015-12-27 NOTE — Telephone Encounter (Signed)
Pt states had surgery with Dr. Despina Hidden yesterday, c/o fever of 100.5, just noticed 30 min ago. Pt states she is taking Hydrocodone-acetaminophen as prescribed, no other complaints. Per Dr. Despina Hidden, walk around, take deep breathes, continue the Hydrocodone as prescribed. If no improvement, pt to call our office back tomorrow. Pt verbalized understanding.

## 2016-01-02 ENCOUNTER — Ambulatory Visit (INDEPENDENT_AMBULATORY_CARE_PROVIDER_SITE_OTHER): Payer: BLUE CROSS/BLUE SHIELD | Admitting: Obstetrics & Gynecology

## 2016-01-02 ENCOUNTER — Encounter: Payer: Self-pay | Admitting: Obstetrics & Gynecology

## 2016-01-02 VITALS — BP 150/92 | HR 98 | Ht 69.0 in | Wt 249.5 lb

## 2016-01-02 DIAGNOSIS — N808 Other endometriosis: Secondary | ICD-10-CM

## 2016-01-02 DIAGNOSIS — Z9889 Other specified postprocedural states: Secondary | ICD-10-CM

## 2016-01-02 DIAGNOSIS — N80C2 Endometriosis of the umbilicus: Secondary | ICD-10-CM

## 2016-01-02 MED ORDER — KETOROLAC TROMETHAMINE 10 MG PO TABS: 10.0000 mg | ORAL_TABLET | Freq: Three times a day (TID) | ORAL | Status: DC | PRN

## 2016-01-02 NOTE — Progress Notes (Signed)
Patient ID: Sonya Chapman, female   DOB: Aug 04, 1981, 35 y.o.   MRN: [id number]  HPI: Patient returns for routine postoperative follow-up having undergone exploration of the anterior abdominal wall to remove left peri umbilical endometriosis which involved fat fascia and muscle(see path report)  on 12/25/2015.  The patient's immediate postoperative recovery has been unremarkable. Since hospital discharge the patient reports some low grade fever every afternoon without associated symptoms.   Current Outpatient Prescriptions: HYDROcodone-acetaminophen (NORCO/VICODIN) 5-325 MG tablet, Take 1 tablet by mouth every 6 (six) hours as needed., Disp: 30 tablet, Rfl: 0 ketorolac (TORADOL) 10 MG tablet, Take 1 tablet (10 mg total) by mouth every 8 (eight) hours as needed., Disp: 15 tablet, Rfl: 0 megestrol (MEGACE) 40 MG tablet, Take 1 tablet (40 mg total) by mouth daily., Disp: 30 tablet, Rfl: 11 Multiple Vitamin (MULTIVITAMIN) tablet, Take 1 tablet by mouth daily., Disp: , Rfl:  ondansetron (ZOFRAN) 8 MG tablet, Take 1 tablet (8 mg total) by mouth every 8 (eight) hours as needed for nausea., Disp: 12 tablet, Rfl: 0 silver sulfADIAZINE (SILVADENE) 1 % cream, Apply 3-4 times to area daily, Disp: 50 g, Rfl: 0  No current facility-administered medications for this visit.    Blood pressure 150/92, pulse 98, height  (1.753 m), weight 249 lb 8 oz (113.172 kg), last menstrual period 10/14/2015.  Physical Exam: Incision clean dry intact staples removed  Dressed Continue silvadene 2-3 times daily Some bruising no evidnce of infection No drainage  Diagnostic Tests:   Pathology: Report reviewed benign  Impression: S/p exploration of abdominal wall to remove endometriosis  Plan:   Follow up: 4  weeks  Lazaro Arms, MD  Meds ordered this encounter  Medications  . ketorolac (TORADOL) 10 MG tablet    Sig: Take 1 tablet (10 mg total) by mouth every 8 (eight) hours as needed.    Dispense:  15  tablet    Refill:  0

## 2016-01-03 ENCOUNTER — Telehealth: Payer: Self-pay | Admitting: Obstetrics & Gynecology

## 2016-01-03 NOTE — Telephone Encounter (Signed)
Pt states saw Dr. Despina Hidden yesterday, had gauzed applied, changing the dressing every hour due to pinkish drainage from incision site, no fever, no heat to touch, no odor to drainage. Per Dr. Despina Hidden, drainage expected, keep incision site clean and dry, covered with gauze. Pt informed to call our office back for fever, inflammation, heat to touch at incision site. Pt verbalized understanding.

## 2016-01-09 ENCOUNTER — Encounter: Payer: Self-pay | Admitting: Obstetrics and Gynecology

## 2016-01-09 ENCOUNTER — Telehealth: Payer: Self-pay | Admitting: Obstetrics & Gynecology

## 2016-01-09 ENCOUNTER — Ambulatory Visit (INDEPENDENT_AMBULATORY_CARE_PROVIDER_SITE_OTHER): Payer: BLUE CROSS/BLUE SHIELD | Admitting: Obstetrics and Gynecology

## 2016-01-09 VITALS — BP 130/96 | Ht 69.0 in | Wt 247.5 lb

## 2016-01-09 DIAGNOSIS — Z09 Encounter for follow-up examination after completed treatment for conditions other than malignant neoplasm: Secondary | ICD-10-CM

## 2016-01-09 DIAGNOSIS — Z9889 Other specified postprocedural states: Secondary | ICD-10-CM

## 2016-01-09 NOTE — Progress Notes (Signed)
Patient ID: Sonya Chapman, female   DOB: 05/04/1981, 35 y.o.   MRN: [id number] Pt worked in today for incision check. Pt states that she isn't sure that the incision is healing right. Pt states that her incision is red and denies any drainage and warmth.

## 2016-01-09 NOTE — Progress Notes (Signed)
Patient ID: Sonya Chapman, female   DOB: 12/12/1980, 35 y.o.   MRN: [id number]  Subjective:  Sonya Chapman is a 35 y.o. female now 2 weeks status post resection of periumbilical endometrioma. She notes the incision appears red, with some white areas. She states she has been applying Silvadene to the area. She denies any drainage or warmth.     Review of Systems Negative except as noted above   Diet:   normal   Bowel movements : normal.  Pain is controlled with current analgesics. Medications being used: hydrocodone.    Objective:  BP 130/96 mmHg  Ht 5\' 9"  (1.753 m)  Wt 247 lb 8 oz (112.265 kg)  BMI 36.53 kg/m2  LMP 10/14/2015 General:Well developed, well nourished.  No acute distress. Abdomen: Bowel sounds normal, soft, non-tender.  Incision(s):   Area of superficial white necrosis of 1-cm long x 5 mm wide angle of the umbilicus. Skin trimmed sharply down to bleeding tissue.   Assessment:  Post-Op 2 weeks s/p resection of periumbilical endometrioma Minor Postop skin necrosis on medial aspect of umbilical incision   Plan:  1.Wound care discussed  Topical neosporin , topical steristrips change daily, may shower,  2. . current medications.n/a 3. Activity restrictions: none 4. return to work: not applicable. 5. Follow up in 2 days for wound re-assessment .  By signing my name below, I, Ronney LionSuzanne Le, attest that this documentation has been prepared under the direction and in the presence of Tilda BurrowJohn Quasean Frye V, MD. Electronically Signed: Ronney LionSuzanne Le, ED Scribe. 01/09/2016. 1:57 PM.  I personally performed the services described in this documentation, which was SCRIBED in my presence. The recorded information has been reviewed and considered accurate. It has been edited as necessary during review. Tilda BurrowFERGUSON,Romain Erion V, MD

## 2016-01-09 NOTE — Telephone Encounter (Addendum)
Pt states concerned she has infection at incision site (surgery 12/26/15) "incision just not healing as inspected, hx of infection after c-section) no fever, no drainage at incision site. Pt given an appt with Dr. Emelda FearFerguson today for evaluation since Dr.Eure out of the office.

## 2016-01-11 ENCOUNTER — Ambulatory Visit (INDEPENDENT_AMBULATORY_CARE_PROVIDER_SITE_OTHER): Payer: BLUE CROSS/BLUE SHIELD | Admitting: Obstetrics and Gynecology

## 2016-01-11 ENCOUNTER — Encounter: Payer: Self-pay | Admitting: Obstetrics and Gynecology

## 2016-01-11 VITALS — BP 126/88 | Ht 69.0 in | Wt 248.0 lb

## 2016-01-11 DIAGNOSIS — Z9889 Other specified postprocedural states: Secondary | ICD-10-CM

## 2016-01-11 DIAGNOSIS — Z5189 Encounter for other specified aftercare: Secondary | ICD-10-CM

## 2016-01-11 NOTE — Progress Notes (Signed)
  Subjective:  Sonya Chapman is a 35 y.o. female now 2 weeks 2 days status post resection periumbilical endometrioma   Pt notes that she is here today for a wound re-check. Wednesday we trimmed the edges of incision where necrosis noted Pt has been using the silvadene and neosporin for the relief of her symptoms. Pt denies any other symptoms.   Review of Systems Negative   Diet:   normal   Bowel movements : normal.  The patient is not having any pain.  Objective:  BP 126/88 mmHg  Ht 5\' 9"  (1.753 m)  Wt 248 lb (112.492 kg)  BMI 36.61 kg/m2  LMP 10/14/2015 General:Well developed, well nourished.  No acute distress. Abdomen: Bowel sounds normal, soft, non-tender. Pelvic Exam:    External Genitalia:  N/A.    Vagina: N/A    Cervix: N/A    Uterus: N/A    Adnexa/Bimanual: N/A  Incision(s):   Improved color, some of the base on the umbilicus side still appears necrotic, with white eschar, edges of necrosis trimmed back to fresh bleeding tissues, no drainage, no erythema, no hernia, no swelling, no dehiscence,  Assessment:  Post-Op 2 weeks 2 days s/p resection periumbilical endometrioma  Removal of skin tissue surrounding incision site.  Doing well postoperatively.   Plan:  1.Wound care discussed  Silvadene and neosporin 2. . current medications. 3. Activity restrictions: none 4. return to work: not applicable. 5. Follow up in 2 weeks.as scheduled   By signing my name below, I, Sonya Chapman, attest that this documentation has been prepared under the direction and in the presence of Tilda BurrowJohn Nashonda Limberg V, MD. Electronically Signed: Soijett Chapman, ED Scribe. 01/11/2016. 11:37 AM.  I personally performed the services described in this documentation, which was SCRIBED in my presence. The recorded information has been reviewed and considered accurate. It has been edited as necessary during review. Tilda BurrowFERGUSON,Anapaola Kinsel V, MD

## 2016-01-30 ENCOUNTER — Ambulatory Visit (INDEPENDENT_AMBULATORY_CARE_PROVIDER_SITE_OTHER): Payer: BLUE CROSS/BLUE SHIELD | Admitting: Obstetrics & Gynecology

## 2016-01-30 ENCOUNTER — Encounter: Payer: Self-pay | Admitting: Obstetrics & Gynecology

## 2016-01-30 VITALS — BP 120/80 | HR 80 | Ht 69.0 in | Wt 252.0 lb

## 2016-01-30 DIAGNOSIS — Z09 Encounter for follow-up examination after completed treatment for conditions other than malignant neoplasm: Secondary | ICD-10-CM

## 2016-01-30 DIAGNOSIS — N80C2 Endometriosis of the umbilicus: Secondary | ICD-10-CM

## 2016-01-30 DIAGNOSIS — Z9889 Other specified postprocedural states: Secondary | ICD-10-CM

## 2016-01-30 DIAGNOSIS — N808 Other endometriosis: Secondary | ICD-10-CM

## 2016-01-30 NOTE — Progress Notes (Signed)
Patient ID: Sonya Chapman, female   DOB: 06/04/1981, 35 y.o.   MRN: [id number] Chief Complaint  Patient presents with  . Routine Post Op    Blood pressure 120/80, pulse 80, height 5\' 9"  (1.753 m), weight 252 lb (114.306 kg), last menstrual period 01/10/2016.  Pt is in for post op visit from an extensive abdominal wall exploration and resection including muscle and fascia of an endometrioma.  She was seen while I was out of the office for some local wound care and the incision has responded well  Today exam reveals continue firmness where it is healing, the resectin was significant and because of the location only had the lateral side to "pull" together, the medial side of the resection was essentially the umbilicus which had no mobility or give  I will see back in 3 months for follow up  Lazaro ArmsEURE,LUTHER H, MD 01/30/2016 2:31 PM

## 2016-05-01 ENCOUNTER — Encounter: Payer: Self-pay | Admitting: Obstetrics & Gynecology

## 2016-05-01 ENCOUNTER — Ambulatory Visit (INDEPENDENT_AMBULATORY_CARE_PROVIDER_SITE_OTHER): Payer: BLUE CROSS/BLUE SHIELD | Admitting: Obstetrics & Gynecology

## 2016-05-01 VITALS — BP 138/92 | HR 80 | Ht 69.0 in | Wt 252.0 lb

## 2016-05-01 DIAGNOSIS — R030 Elevated blood-pressure reading, without diagnosis of hypertension: Secondary | ICD-10-CM | POA: Diagnosis not present

## 2016-05-01 DIAGNOSIS — N808 Other endometriosis: Secondary | ICD-10-CM

## 2016-05-01 DIAGNOSIS — N80C2 Endometriosis of the umbilicus: Secondary | ICD-10-CM

## 2016-05-01 MED ORDER — KETOROLAC TROMETHAMINE 10 MG PO TABS
10.0000 mg | ORAL_TABLET | Freq: Three times a day (TID) | ORAL | Status: DC | PRN
Start: 2016-05-01 — End: 2016-10-30

## 2016-05-01 MED ORDER — DESOGESTREL-ETHINYL ESTRADIOL 0.15-30 MG-MCG PO TABS: 1.0000 | ORAL_TABLET | Freq: Every day | ORAL | Status: DC

## 2016-05-01 NOTE — Progress Notes (Signed)
Patient ID: Sonya Chapman, female   DOB: 03/22/1981, 35 y.o.   MRN: [id number]      Chief Complaint  Patient presents with  . Follow-up    Resection of Endometrioma - 12/26/2015    Blood pressure (!) 138/92, pulse 80, height 5\' 9"  (1.753 m), weight 252 lb (114.3 kg), last menstrual period 04/19/2016.  35 y.o. G1P0 Patient's last menstrual period was 04/19/2016. The current method of family planning is OCP (estrogen/progesterone).  Subjective Pt is having a bit of discomfort again in the area of the resected endometrioma, done back in February Also a bit firmer maybe and a little larger  Objective Possible ongoing soft tissue healing vs recurrence  Pertinent ROS No burning with urination, frequency or urgency No nausea, vomiting or diarrhea Nor fever chills or other constitutional symptoms   Labs or studies     Impression Diagnoses this Encounter::   ICD-9-CM ICD-10-CM   1. Endometriosis of umbilicus 617.8 N80.8     Established relevant diagnosis(es):   Plan/Recommendations: Meds ordered this encounter  Medications  . ketorolac (TORADOL) 10 MG tablet    Sig: Take 1 tablet (10 mg total) by mouth every 8 (eight) hours as needed.    Dispense:  15 tablet    Refill:  2  . desogestrel-ethinyl estradiol (APRI,EMOQUETTE,SOLIA) 0.15-30 MG-MCG tablet    Sig: Take 1 tablet by mouth daily.    Dispense:  1 Package    Refill:  11    Please hold the prescription for future filling per pt instructions    Labs or Scans Ordered: No orders of the defined types were placed in this encounter.   Management:: OCP+toradol for discomfort associated with menses Time will tell if the area returns I did a very large resection including fascia and muscle and got back to supple not indurated tissue, pt wants to have embryos implanted maybe later this year so will wait and see how her symptoms evolve  Follow up Return if symptoms worsen or fail to improve.        Face to face  time:  15 minutes  Greater than 50% of the visit time was spent in counseling and coordination of care with the patient.  The summary and outline of the counseling and care coordination is summarized in the note above.   All questions were answered.

## 2016-05-16 ENCOUNTER — Encounter: Payer: Self-pay | Admitting: Obstetrics & Gynecology

## 2016-06-20 DIAGNOSIS — N979 Female infertility, unspecified: Secondary | ICD-10-CM | POA: Diagnosis not present

## 2016-07-02 DIAGNOSIS — N979 Female infertility, unspecified: Secondary | ICD-10-CM | POA: Diagnosis not present

## 2016-08-04 DIAGNOSIS — Z3200 Encounter for pregnancy test, result unknown: Secondary | ICD-10-CM | POA: Diagnosis not present

## 2016-08-22 DIAGNOSIS — O200 Threatened abortion: Secondary | ICD-10-CM | POA: Diagnosis not present

## 2016-09-03 DIAGNOSIS — O200 Threatened abortion: Secondary | ICD-10-CM | POA: Diagnosis not present

## 2016-09-04 ENCOUNTER — Encounter: Payer: Self-pay | Admitting: *Deleted

## 2016-09-04 DIAGNOSIS — Z23 Encounter for immunization: Secondary | ICD-10-CM | POA: Diagnosis not present

## 2016-09-18 ENCOUNTER — Ambulatory Visit (INDEPENDENT_AMBULATORY_CARE_PROVIDER_SITE_OTHER): Payer: BLUE CROSS/BLUE SHIELD | Admitting: Advanced Practice Midwife

## 2016-09-18 ENCOUNTER — Encounter: Payer: Self-pay | Admitting: Advanced Practice Midwife

## 2016-09-18 VITALS — BP 134/88 | HR 84

## 2016-09-18 DIAGNOSIS — Z3682 Encounter for antenatal screening for nuchal translucency: Secondary | ICD-10-CM

## 2016-09-18 DIAGNOSIS — Z3490 Encounter for supervision of normal pregnancy, unspecified, unspecified trimester: Secondary | ICD-10-CM | POA: Insufficient documentation

## 2016-09-18 DIAGNOSIS — Z3481 Encounter for supervision of other normal pregnancy, first trimester: Secondary | ICD-10-CM

## 2016-09-18 DIAGNOSIS — Z331 Pregnant state, incidental: Secondary | ICD-10-CM

## 2016-09-18 DIAGNOSIS — Z1389 Encounter for screening for other disorder: Secondary | ICD-10-CM

## 2016-09-18 DIAGNOSIS — Z3A11 11 weeks gestation of pregnancy: Secondary | ICD-10-CM

## 2016-09-18 LAB — POCT URINALYSIS DIPSTICK
Glucose, UA: NEGATIVE
Ketones, UA: NEGATIVE
LEUKOCYTES UA: NEGATIVE
NITRITE UA: NEGATIVE

## 2016-09-18 NOTE — Progress Notes (Signed)
  Subjective:    Sonya Chapman is a G2P1000 5872w6d being seen today for her first obstetrical visit.  Her obstetrical history is significant for IVF X12.  Pregnancy history fully reviewed. She had PROM at 39+ weeks ,Failed IOL and had CS. Definitely wants CS  Patient reports no complaints.  Vitals:   09/18/16 1630  BP: 134/88  Pulse: 84    HISTORY: OB History  Gravida Para Term Preterm AB Living  2 1 1         SAB TAB Ectopic Multiple Live Births               # Outcome Date GA Lbr Len/2nd Weight Sex Delivery Anes PTL Lv  2 Current           1 Term 05/24/11 4858w4d  8 lb 1 oz (3.657 kg) F CS-Unspec        Complications: Failure to Progress in First Stage    Obstetric Comments  IVF pregnancy,incision abscess   Past Medical History:  Diagnosis Date  . Endometrioma    Past Surgical History:  Procedure Laterality Date  . CESAREAN SECTION    . EXCISION OF ENDOMETRIOMA  12/26/2015   Procedure: RESECTION OF PERIUMBILICAL ENDOMETRIOMA;  Surgeon: Lazaro ArmsLuther H Eure, MD;  Location: [facility];  Service: Gynecology;;  . WISDOM TOOTH EXTRACTION     Family History  Problem Relation Age of Onset  . Hypertension Mother   . Thyroid disease Mother   . Hypertension Father      Exam       Pelvic Exam:    Perineum: Normal Perineum   Vulva: normal   Vagina:  normal mucosa, normal discharge, no palpable nodules   Uterus Normal, Gravid, FH: 11     Cervix: normal   Adnexa: Not palpable   Urinary:  urethral meatus normal    System:     Skin: normal coloration and turgor, no rashes    Neurologic: oriented, normal, normal mood   Extremities: normal strength, tone, and muscle mass   HEENT PERRLA   Mouth/Teeth mucous membranes moist, normla dentition   Neck supple and no masses   Cardiovascular: regular rate and rhythm   Respiratory:  appears well, vitals normal, no respiratory distress, acyanotic   Abdomen: soft, non-tender;  FHR: 160 US          Assessment:    Pregnancy:  G2P1000 Patient Active Problem List   Diagnosis Date Noted  . Supervision of normal pregnancy 09/18/2016  . Encounter for wound care 01/11/2016  . Obesity 10/16/2014  . Abnormal Pap smear of cervix, ASCUS-09/2002 10/16/2014        Plan:     Initial labs drawn. Continue prenatal vitamins  Problem list reviewed and updated  Reviewed n/v relief measures and warning s/s to report  Reviewed recommended weight gain based on pre-gravid BMI  Encouraged well-balanced diet Genetic Screening discussed Integrated Screen: requested.  Ultrasound discussed; fetal survey: requested  .CCNC not done (not applying for medicaid)  Return in about 2 weeks (around 10/02/2016) for LROB, US:NT+1st IT.  CRESENZO-DISHMAN,Anwar Sakata 09/23/2016

## 2016-09-18 NOTE — Patient Instructions (Signed)
 First Trimester of Pregnancy The first trimester of pregnancy is from week 1 until the end of week 12 (months 1 through 3). A week after a sperm fertilizes an egg, the egg will implant on the wall of the uterus. This embryo will begin to develop into a baby. Genes from you and your partner are forming the baby. The female genes determine whether the baby is a boy or a girl. At 6-8 weeks, the eyes and face are formed, and the heartbeat can be seen on ultrasound. At the end of 12 weeks, all the baby's organs are formed.  Now that you are pregnant, you will want to do everything you can to have a healthy baby. Two of the most important things are to get good prenatal care and to follow your health care provider's instructions. Prenatal care is all the medical care you receive before the baby's birth. This care will help prevent, find, and treat any problems during the pregnancy and childbirth. BODY CHANGES Your body goes through many changes during pregnancy. The changes vary from woman to woman.   You may gain or lose a couple of pounds at first.  You may feel sick to your stomach (nauseous) and throw up (vomit). If the vomiting is uncontrollable, call your health care provider.  You may tire easily.  You may develop headaches that can be relieved by medicines approved by your health care provider.  You may urinate more often. Painful urination may mean you have a bladder infection.  You may develop heartburn as a result of your pregnancy.  You may develop constipation because certain hormones are causing the muscles that push waste through your intestines to slow down.  You may develop hemorrhoids or swollen, bulging veins (varicose veins).  Your breasts may begin to grow larger and become tender. Your nipples may stick out more, and the tissue that surrounds them (areola) may become darker.  Your gums may bleed and may be sensitive to brushing and flossing.  Dark spots or blotches  (chloasma, mask of pregnancy) may develop on your face. This will likely fade after the baby is born.  Your menstrual periods will stop.  You may have a loss of appetite.  You may develop cravings for certain kinds of food.  You may have changes in your emotions from day to day, such as being excited to be pregnant or being concerned that something may go wrong with the pregnancy and baby.  You may have more vivid and strange dreams.  You may have changes in your hair. These can include thickening of your hair, rapid growth, and changes in texture. Some women also have hair loss during or after pregnancy, or hair that feels dry or thin. Your hair will most likely return to normal after your baby is born. WHAT TO EXPECT AT YOUR PRENATAL VISITS During a routine prenatal visit:  You will be weighed to make sure you and the baby are growing normally.  Your blood pressure will be taken.  Your abdomen will be measured to track your baby's growth.  The fetal heartbeat will be listened to starting around week 10 or 12 of your pregnancy.  Test results from any previous visits will be discussed. Your health care provider may ask you:  How you are feeling.  If you are feeling the baby move.  If you have had any abnormal symptoms, such as leaking fluid, bleeding, severe headaches, or abdominal cramping.  If you have any questions. Other   tests that may be performed during your first trimester include:  Blood tests to find your blood type and to check for the presence of any previous infections. They will also be used to check for low iron levels (anemia) and Rh antibodies. Later in the pregnancy, blood tests for diabetes will be done along with other tests if problems develop.  Urine tests to check for infections, diabetes, or protein in the urine.  An ultrasound to confirm the proper growth and development of the baby.  An amniocentesis to check for possible genetic problems.  Fetal  screens for spina bifida and Down syndrome.  You may need other tests to make sure you and the baby are doing well. HOME CARE INSTRUCTIONS  Medicines  Follow your health care provider's instructions regarding medicine use. Specific medicines may be either safe or unsafe to take during pregnancy.  Take your prenatal vitamins as directed.  If you develop constipation, try taking a stool softener if your health care provider approves. Diet  Eat regular, well-balanced meals. Choose a variety of foods, such as meat or vegetable-based protein, fish, milk and low-fat dairy products, vegetables, fruits, and whole grain breads and cereals. Your health care provider will help you determine the amount of weight gain that is right for you.  Avoid raw meat and uncooked cheese. These carry germs that can cause birth defects in the baby.  Eating four or five small meals rather than three large meals a day may help relieve nausea and vomiting. If you start to feel nauseous, eating a few soda crackers can be helpful. Drinking liquids between meals instead of during meals also seems to help nausea and vomiting.  If you develop constipation, eat more high-fiber foods, such as fresh vegetables or fruit and whole grains. Drink enough fluids to keep your urine clear or pale yellow. Activity and Exercise  Exercise only as directed by your health care provider. Exercising will help you:  Control your weight.  Stay in shape.  Be prepared for labor and delivery.  Experiencing pain or cramping in the lower abdomen or low back is a good sign that you should stop exercising. Check with your health care provider before continuing normal exercises.  Try to avoid standing for long periods of time. Move your legs often if you must stand in one place for a long time.  Avoid heavy lifting.  Wear low-heeled shoes, and practice good posture.  You may continue to have sex unless your health care provider directs you  otherwise. Relief of Pain or Discomfort  Wear a good support bra for breast tenderness.   Take warm sitz baths to soothe any pain or discomfort caused by hemorrhoids. Use hemorrhoid cream if your health care provider approves.   Rest with your legs elevated if you have leg cramps or low back pain.  If you develop varicose veins in your legs, wear support hose. Elevate your feet for 15 minutes, 3-4 times a day. Limit salt in your diet. Prenatal Care  Schedule your prenatal visits by the twelfth week of pregnancy. They are usually scheduled monthly at first, then more often in the last 2 months before delivery.  Write down your questions. Take them to your prenatal visits.  Keep all your prenatal visits as directed by your health care provider. Safety  Wear your seat belt at all times when driving.  Make a list of emergency phone numbers, including numbers for family, friends, the hospital, and police and fire departments. General   Tips  Ask your health care provider for a referral to a local prenatal education class. Begin classes no later than at the beginning of month 6 of your pregnancy.  Ask for help if you have counseling or nutritional needs during pregnancy. Your health care provider can offer advice or refer you to specialists for help with various needs.  Do not use hot tubs, steam rooms, or saunas.  Do not douche or use tampons or scented sanitary pads.  Do not cross your legs for long periods of time.  Avoid cat litter boxes and soil used by cats. These carry germs that can cause birth defects in the baby and possibly loss of the fetus by miscarriage or stillbirth.  Avoid all smoking, herbs, alcohol, and medicines not prescribed by your health care provider. Chemicals in these affect the formation and growth of the baby.  Schedule a dentist appointment. At home, brush your teeth with a soft toothbrush and be gentle when you floss. SEEK MEDICAL CARE IF:   You have  dizziness.  You have mild pelvic cramps, pelvic pressure, or nagging pain in the abdominal area.  You have persistent nausea, vomiting, or diarrhea.  You have a bad smelling vaginal discharge.  You have pain with urination.  You notice increased swelling in your face, hands, legs, or ankles. SEEK IMMEDIATE MEDICAL CARE IF:   You have a fever.  You are leaking fluid from your vagina.  You have spotting or bleeding from your vagina.  You have severe abdominal cramping or pain.  You have rapid weight gain or loss.  You vomit blood or material that looks like coffee grounds.  You are exposed to German measles and have never had them.  You are exposed to fifth disease or chickenpox.  You develop a severe headache.  You have shortness of breath.  You have any kind of trauma, such as from a fall or a car accident. Document Released: 10/14/2001 Document Revised: 03/06/2014 Document Reviewed: 08/30/2013 Exit[employer] Patient Information 2015 [employer]. This information is not intended to replace advice given to you by your health care provider. Make sure you discuss any questions you have with your health care provider.   Nausea & Vomiting  Have saltine crackers or pretzels by your bed and eat a few bites before you raise your head out of bed in the morning  Eat small frequent meals throughout the day instead of large meals  Drink plenty of fluids throughout the day to stay hydrated, just don't drink a lot of fluids with your meals.  This can make your stomach fill up faster making you feel sick  Do not brush your teeth right after you eat  Products with real ginger are good for nausea, like ginger ale and ginger hard candy Make sure it says made with real ginger!  Sucking on sour candy like lemon heads is also good for nausea  If your prenatal vitamins make you nauseated, take them at night so you will sleep through the nausea  Sea Bands  If you feel like you need  medicine for the nausea & vomiting please let us know  If you are unable to keep any fluids or food down please let us know   Constipation  Drink plenty of fluid, preferably water, throughout the day  Eat foods high in fiber such as fruits, vegetables, and grains  Exercise, such as walking, is a good way to keep your bowels regular  Drink warm fluids, especially warm   prune juice, or decaf coffee  Eat a 1/2 cup of real oatmeal (not instant), 1/2 cup applesauce, and 1/2-1 cup warm prune juice every day  If needed, you may take Colace (docusate sodium) stool softener once or twice a day to help keep the stool soft. If you are pregnant, wait until you are out of your first trimester (12-14 weeks of pregnancy)  If you still are having problems with constipation, you may take Miralax once daily as needed to help keep your bowels regular.  If you are pregnant, wait until you are out of your first trimester (12-14 weeks of pregnancy)  Safe Medications in Pregnancy   Acne: Benzoyl Peroxide Salicylic Acid  Backache/Headache: Tylenol: 2 regular strength every 4 hours OR              2 Extra strength every 6 hours  Colds/Coughs/Allergies: Benadryl (alcohol free) 25 mg every 6 hours as needed Breath right strips Claritin Cepacol throat lozenges Chloraseptic throat spray Cold-Eeze- up to three times per day Cough drops, alcohol free Flonase (by prescription only) Guaifenesin Mucinex Robitussin DM (plain only, alcohol free) Saline nasal spray/drops Sudafed (pseudoephedrine) & Actifed ** use only after [redacted] weeks gestation and if you do not have high blood pressure Tylenol Vicks Vaporub Zinc lozenges Zyrtec   Constipation: Colace Ducolax suppositories Fleet enema Glycerin suppositories Metamucil Milk of magnesia Miralax Senokot Smooth move tea  Diarrhea: Kaopectate Imodium A-D  *NO pepto Bismol  Hemorrhoids: Anusol Anusol HC Preparation  H Tucks  Indigestion: Tums Maalox Mylanta Zantac  Pepcid  Insomnia: Benadryl (alcohol free) 25mg every 6 hours as needed Tylenol PM Unisom, no Gelcaps  Leg Cramps: Tums MagGel  Nausea/Vomiting:  Bonine Dramamine Emetrol Ginger extract Sea bands Meclizine  Nausea medication to take during pregnancy:  Unisom (doxylamine succinate 25 mg tablets) Take one tablet daily at bedtime. If symptoms are not adequately controlled, the dose can be increased to a maximum recommended dose of two tablets daily (1/2 tablet in the morning, 1/2 tablet mid-afternoon and one at bedtime). Vitamin B6 100mg tablets. Take one tablet twice a day (up to 200 mg per day).  Skin Rashes: Aveeno products Benadryl cream or 25mg every 6 hours as needed Calamine Lotion 1% cortisone cream  Yeast infection: Gyne-lotrimin 7 Monistat 7   **If taking multiple medications, please check labels to avoid duplicating the same active ingredients **take medication as directed on the label ** Do not exceed 4000 mg of tylenol in 24 hours **Do not take medications that contain aspirin or ibuprofen      

## 2016-09-19 DIAGNOSIS — Z3481 Encounter for supervision of other normal pregnancy, first trimester: Secondary | ICD-10-CM | POA: Diagnosis not present

## 2016-09-19 LAB — CBC
HEMOGLOBIN: 13.2 g/dL (ref 11.1–15.9)
Hematocrit: 39.1 % (ref 34.0–46.6)
MCH: 29.7 pg (ref 26.6–33.0)
MCHC: 33.8 g/dL (ref 31.5–35.7)
MCV: 88 fL (ref 79–97)
PLATELETS: 223 10*3/uL (ref 150–379)
RBC: 4.45 x10E6/uL (ref 3.77–5.28)
RDW: 13.7 % (ref 12.3–15.4)
WBC: 9.9 10*3/uL (ref 3.4–10.8)

## 2016-09-19 LAB — RUBELLA SCREEN: Rubella Antibodies, IGG: 32.1 index (ref >0.99)

## 2016-09-19 LAB — HEPATITIS B SURFACE ANTIGEN: Hepatitis B Surface Ag: NEGATIVE

## 2016-09-19 LAB — ABO/RH: RH TYPE: POSITIVE

## 2016-09-19 LAB — VARICELLA ZOSTER ANTIBODY, IGG: Varicella zoster IgG: 1445 index (ref >165)

## 2016-09-19 LAB — HIV ANTIBODY (ROUTINE TESTING W REFLEX): HIV SCREEN 4TH GENERATION: NONREACTIVE

## 2016-09-19 LAB — ANTIBODY SCREEN: Antibody Screen: NEGATIVE

## 2016-09-19 LAB — RPR: RPR: NONREACTIVE

## 2016-09-22 LAB — GC/CHLAMYDIA PROBE AMP
CHLAMYDIA, DNA PROBE: NEGATIVE
NEISSERIA GONORRHOEAE BY PCR: NEGATIVE

## 2016-09-23 LAB — URINE CULTURE

## 2016-09-24 ENCOUNTER — Other Ambulatory Visit: Payer: Self-pay | Admitting: Advanced Practice Midwife

## 2016-09-24 ENCOUNTER — Telehealth: Payer: Self-pay | Admitting: *Deleted

## 2016-09-24 MED ORDER — SULFAMETHOXAZOLE-TRIMETHOPRIM 800-160 MG PO TABS: 1.0000 | ORAL_TABLET | Freq: Two times a day (BID) | ORAL | 0 refills | Status: DC

## 2016-09-24 NOTE — Telephone Encounter (Signed)
Informed patient of prescription sent to pharmacy for positive urine culture.

## 2016-09-24 NOTE — Progress Notes (Signed)
Please let her know that her urine grew out bacteria.  It is resistant to Macrobid, she is allergic to PCN, so only option is sulfa--this is safe until the 3rd trimester.  Rx sent to pharmacy

## 2016-10-02 ENCOUNTER — Encounter: Payer: Self-pay | Admitting: Advanced Practice Midwife

## 2016-10-02 ENCOUNTER — Ambulatory Visit (INDEPENDENT_AMBULATORY_CARE_PROVIDER_SITE_OTHER): Payer: BLUE CROSS/BLUE SHIELD

## 2016-10-02 ENCOUNTER — Other Ambulatory Visit: Payer: BLUE CROSS/BLUE SHIELD

## 2016-10-02 ENCOUNTER — Ambulatory Visit (INDEPENDENT_AMBULATORY_CARE_PROVIDER_SITE_OTHER): Payer: BLUE CROSS/BLUE SHIELD | Admitting: Advanced Practice Midwife

## 2016-10-02 VITALS — BP 152/92 | HR 86 | Wt 244.0 lb

## 2016-10-02 DIAGNOSIS — Z3682 Encounter for antenatal screening for nuchal translucency: Secondary | ICD-10-CM

## 2016-10-02 DIAGNOSIS — Z3A13 13 weeks gestation of pregnancy: Secondary | ICD-10-CM

## 2016-10-02 DIAGNOSIS — Z331 Pregnant state, incidental: Secondary | ICD-10-CM

## 2016-10-02 DIAGNOSIS — Z3481 Encounter for supervision of other normal pregnancy, first trimester: Secondary | ICD-10-CM

## 2016-10-02 DIAGNOSIS — Z3482 Encounter for supervision of other normal pregnancy, second trimester: Secondary | ICD-10-CM

## 2016-10-02 DIAGNOSIS — Z3401 Encounter for supervision of normal first pregnancy, first trimester: Secondary | ICD-10-CM

## 2016-10-02 DIAGNOSIS — R030 Elevated blood-pressure reading, without diagnosis of hypertension: Secondary | ICD-10-CM

## 2016-10-02 DIAGNOSIS — Z1389 Encounter for screening for other disorder: Secondary | ICD-10-CM

## 2016-10-02 LAB — POCT URINALYSIS DIPSTICK
Glucose, UA: NEGATIVE
Ketones, UA: NEGATIVE
Leukocytes, UA: NEGATIVE
Nitrite, UA: NEGATIVE
PROTEIN UA: NEGATIVE
RBC UA: NEGATIVE

## 2016-10-02 NOTE — Progress Notes (Signed)
G2P1000 7371w6d Estimated Date of Delivery: 04/10/17  Blood pressure (!) 152/92, pulse 86, weight 244 lb (110.7 kg), last menstrual period 04/19/2016.   BP weight and urine results all reviewed and noted.  Please refer to the obstetrical flow sheet for the fundal height and fetal heart rate documentation:  Patient  denies any bleeding and no rupture of membranes symptoms or regular contractions. Patient is without complaints. All questions were answered.  Has had "borderline HTN" for about a year.  Thinking will probably be CHTN.    Orders Placed This Encounter  Procedures  . Maternal Screen, Integrated #1  . POCT urinalysis dipstick    Plan:  Continued routine obstetrical care,  Start ASA 81 mg (had been on it until 11/10 anyway for in vitro)  Return in about 4 weeks (around 10/30/2016) for 2nd IT, LROB.

## 2016-10-02 NOTE — Progress Notes (Signed)
US 12+6 wks,measurements c/w dates,crl 70.3 mm,NB present,NT 2 mm, normal ov's bilat,fhr 158 bpm

## 2016-10-06 LAB — MATERNAL SCREEN, INTEGRATED #1
Crown Rump Length: 70.3 mm
Gest. Age on Collection Date: 13 weeks
Maternal Age at EDD: 38.1 years
Nuchal Translucency (NT): 2 mm
Number of Fetuses: 1
PAPP-A Value: 418.9 ng/mL
Weight: 244 [lb_av]

## 2016-10-30 ENCOUNTER — Encounter: Payer: BLUE CROSS/BLUE SHIELD | Admitting: Obstetrics & Gynecology

## 2016-10-30 ENCOUNTER — Ambulatory Visit (INDEPENDENT_AMBULATORY_CARE_PROVIDER_SITE_OTHER): Payer: BLUE CROSS/BLUE SHIELD | Admitting: Obstetrics & Gynecology

## 2016-10-30 ENCOUNTER — Encounter: Payer: Self-pay | Admitting: Obstetrics & Gynecology

## 2016-10-30 VITALS — BP 135/63 | HR 121 | Wt 246.0 lb

## 2016-10-30 DIAGNOSIS — Z369 Encounter for antenatal screening, unspecified: Secondary | ICD-10-CM | POA: Diagnosis not present

## 2016-10-30 DIAGNOSIS — Z3482 Encounter for supervision of other normal pregnancy, second trimester: Secondary | ICD-10-CM

## 2016-10-30 DIAGNOSIS — Z331 Pregnant state, incidental: Secondary | ICD-10-CM

## 2016-10-30 DIAGNOSIS — Z1389 Encounter for screening for other disorder: Secondary | ICD-10-CM

## 2016-10-30 DIAGNOSIS — Z3A17 17 weeks gestation of pregnancy: Secondary | ICD-10-CM

## 2016-10-30 NOTE — Progress Notes (Signed)
G2P1000 4417w6d Estimated Date of Delivery: 04/10/17  Blood pressure 135/63, pulse (!) 121, weight 246 lb (111.6 kg), last menstrual period 04/19/2016.   BP weight and urine results all reviewed and noted.  Please refer to the obstetrical flow sheet for the fundal height and fetal heart rate documentation:  Patient reports good fetal movement, denies any bleeding and no rupture of membranes symptoms or regular contractions. Patient is without complaints. All questions were answered.  Orders Placed This Encounter  Procedures  . US OB Comp + 14 Wk  . Maternal Screen, Integrated #2  . POCT urinalysis dipstick    Plan:  Continued routine obstetrical care, 2nd IT today  Return in about 3 weeks (around 11/20/2016) for 20 week sono, LROB.

## 2016-11-04 LAB — MATERNAL SCREEN, INTEGRATED #2
ADSF: 1.37
AFP MARKER: 28.4 ng/mL
AFP MOM: 1.21
CROWN RUMP LENGTH: 70.3 mm
DIA MoM: 3.28
DIA Value: 430.9 pg/mL
ESTRIOL UNCONJUGATED: 1.18 ng/mL
GEST. AGE ON COLLECTION DATE: 13 wk
GESTATIONAL AGE: 17 wk
HCG VALUE: 36.9 [IU]/mL
MATERNAL AGE AT EDD: 38.1 a
NUMBER OF FETUSES: 1
Nuchal Translucency (NT): 2 mm
Nuchal Translucency MoM: 1.15
PAPP-A MoM: 0.67
PAPP-A VALUE: 418.9 ng/mL
Test Results:: NEGATIVE
WEIGHT: 244 [lb_av]
WEIGHT: 244 [lb_av]
hCG MoM: 1.88

## 2016-11-18 ENCOUNTER — Other Ambulatory Visit: Payer: Self-pay | Admitting: Obstetrics & Gynecology

## 2016-11-18 DIAGNOSIS — Z363 Encounter for antenatal screening for malformations: Secondary | ICD-10-CM

## 2016-11-19 ENCOUNTER — Encounter: Payer: BLUE CROSS/BLUE SHIELD | Admitting: Advanced Practice Midwife

## 2016-11-19 ENCOUNTER — Other Ambulatory Visit: Payer: BLUE CROSS/BLUE SHIELD

## 2016-11-24 ENCOUNTER — Ambulatory Visit (INDEPENDENT_AMBULATORY_CARE_PROVIDER_SITE_OTHER): Payer: BLUE CROSS/BLUE SHIELD | Admitting: Obstetrics & Gynecology

## 2016-11-24 ENCOUNTER — Ambulatory Visit (INDEPENDENT_AMBULATORY_CARE_PROVIDER_SITE_OTHER): Payer: BLUE CROSS/BLUE SHIELD

## 2016-11-24 ENCOUNTER — Encounter: Payer: Self-pay | Admitting: Obstetrics & Gynecology

## 2016-11-24 VITALS — BP 122/80 | HR 84 | Wt 250.0 lb

## 2016-11-24 DIAGNOSIS — Z3A20 20 weeks gestation of pregnancy: Secondary | ICD-10-CM

## 2016-11-24 DIAGNOSIS — O321XX1 Maternal care for breech presentation, fetus 1: Secondary | ICD-10-CM | POA: Diagnosis not present

## 2016-11-24 DIAGNOSIS — Z1389 Encounter for screening for other disorder: Secondary | ICD-10-CM

## 2016-11-24 DIAGNOSIS — Z363 Encounter for antenatal screening for malformations: Secondary | ICD-10-CM | POA: Diagnosis not present

## 2016-11-24 DIAGNOSIS — R030 Elevated blood-pressure reading, without diagnosis of hypertension: Secondary | ICD-10-CM

## 2016-11-24 DIAGNOSIS — Z331 Pregnant state, incidental: Secondary | ICD-10-CM

## 2016-11-24 DIAGNOSIS — Z3402 Encounter for supervision of normal first pregnancy, second trimester: Secondary | ICD-10-CM

## 2016-11-24 DIAGNOSIS — Z3482 Encounter for supervision of other normal pregnancy, second trimester: Secondary | ICD-10-CM

## 2016-11-24 LAB — POCT URINALYSIS DIPSTICK
GLUCOSE UA: NEGATIVE
Ketones, UA: NEGATIVE
LEUKOCYTES UA: NEGATIVE
NITRITE UA: NEGATIVE
Protein, UA: NEGATIVE
RBC UA: NEGATIVE

## 2016-11-24 MED ORDER — OMEPRAZOLE 20 MG PO CPDR: 20.0000 mg | DELAYED_RELEASE_CAPSULE | Freq: Every day | ORAL | 6 refills | Status: DC

## 2016-11-24 NOTE — Progress Notes (Signed)
G2P1000 1510w3d Estimated Date of Delivery: 04/10/17  Blood pressure 122/80, pulse 84, weight 250 lb (113.4 kg), last menstrual period 04/19/2016.   BP weight and urine results all reviewed and noted.  Please refer to the obstetrical flow sheet for the fundal height and fetal heart rate documentation:  Patient reports good fetal movement, denies any bleeding and no rupture of membranes symptoms or regular contractions. Patient is without complaints. All questions were answered.  Orders Placed This Encounter  Procedures  . POCT urinalysis dipstick    Plan:  Continued routine obstetrical care, sonogram I snormal see report  No Follow-up on file.

## 2016-11-24 NOTE — Progress Notes (Signed)
US 20+3 wks,breech,post pl gr 0,normal ov's bilat,cx 4 cm,svp of fluid 4.8 cm,fhr 153 bpm,efw 390 g,anatomy complete,no obvious abnormalities seen

## 2016-12-22 ENCOUNTER — Encounter: Payer: BLUE CROSS/BLUE SHIELD | Admitting: Obstetrics & Gynecology

## 2016-12-23 ENCOUNTER — Ambulatory Visit (INDEPENDENT_AMBULATORY_CARE_PROVIDER_SITE_OTHER): Payer: BLUE CROSS/BLUE SHIELD | Admitting: Obstetrics & Gynecology

## 2016-12-23 ENCOUNTER — Encounter: Payer: Self-pay | Admitting: Obstetrics & Gynecology

## 2016-12-23 VITALS — BP 120/80 | HR 74 | Wt 254.0 lb

## 2016-12-23 DIAGNOSIS — Z3482 Encounter for supervision of other normal pregnancy, second trimester: Secondary | ICD-10-CM

## 2016-12-23 DIAGNOSIS — Z3A24 24 weeks gestation of pregnancy: Secondary | ICD-10-CM

## 2016-12-23 DIAGNOSIS — Z1389 Encounter for screening for other disorder: Secondary | ICD-10-CM

## 2016-12-23 DIAGNOSIS — Z331 Pregnant state, incidental: Secondary | ICD-10-CM

## 2016-12-23 LAB — POCT URINALYSIS DIPSTICK
Blood, UA: NEGATIVE
Glucose, UA: NEGATIVE
Ketones, UA: NEGATIVE
LEUKOCYTES UA: NEGATIVE
Nitrite, UA: NEGATIVE
PROTEIN UA: NEGATIVE

## 2016-12-23 NOTE — Progress Notes (Signed)
G2P1000 9271w4d Estimated Date of Delivery: 04/10/17  Blood pressure 120/80, pulse 74, weight 254 lb (115.2 kg), last menstrual period 04/19/2016.   BP weight and urine results all reviewed and noted.  Please refer to the obstetrical flow sheet for the fundal height and fetal heart rate documentation:  Patient reports good fetal movement, denies any bleeding and no rupture of membranes symptoms or regular contractions. Patient is without complaints. All questions were answered.  Orders Placed This Encounter  Procedures  . 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

## 2017-01-14 ENCOUNTER — Other Ambulatory Visit: Payer: BLUE CROSS/BLUE SHIELD

## 2017-01-14 ENCOUNTER — Encounter: Payer: Self-pay | Admitting: Advanced Practice Midwife

## 2017-01-14 ENCOUNTER — Ambulatory Visit (INDEPENDENT_AMBULATORY_CARE_PROVIDER_SITE_OTHER): Payer: BLUE CROSS/BLUE SHIELD | Admitting: Advanced Practice Midwife

## 2017-01-14 VITALS — BP 140/70 | HR 92 | Wt 256.5 lb

## 2017-01-14 DIAGNOSIS — Z131 Encounter for screening for diabetes mellitus: Secondary | ICD-10-CM | POA: Diagnosis not present

## 2017-01-14 DIAGNOSIS — R8271 Bacteriuria: Secondary | ICD-10-CM | POA: Diagnosis not present

## 2017-01-14 DIAGNOSIS — Z1339 Encounter for screening examination for other mental health and behavioral disorders: Secondary | ICD-10-CM

## 2017-01-14 DIAGNOSIS — Z331 Pregnant state, incidental: Secondary | ICD-10-CM

## 2017-01-14 DIAGNOSIS — Z3A28 28 weeks gestation of pregnancy: Secondary | ICD-10-CM

## 2017-01-14 DIAGNOSIS — Z3482 Encounter for supervision of other normal pregnancy, second trimester: Secondary | ICD-10-CM

## 2017-01-14 DIAGNOSIS — Z1389 Encounter for screening for other disorder: Secondary | ICD-10-CM

## 2017-01-14 DIAGNOSIS — Z3402 Encounter for supervision of normal first pregnancy, second trimester: Secondary | ICD-10-CM | POA: Diagnosis not present

## 2017-01-14 LAB — POCT URINALYSIS DIPSTICK
Blood, UA: NEGATIVE
Glucose, UA: NEGATIVE
Glucose, UA: NEGATIVE
Ketones, UA: NEGATIVE
NITRITE UA: NEGATIVE
PROTEIN UA: NEGATIVE
Protein, UA: NEGATIVE

## 2017-01-14 NOTE — Progress Notes (Signed)
G2P1001 63103w5d Estimated Date of Delivery: 04/10/17  Blood pressure 140/70, pulse 92, weight 256 lb 8 oz (116.3 kg), last menstrual period 04/19/2016.   BP weight and urine results all reviewed and noted.  Please refer to the obstetrical flow sheet for the fundal height and fetal heart rate documentation:  Patient reports good fetal movement, denies any bleeding and no rupture of membranes symptoms or regular contractions. Patient is without complaints. All questions were answered.  Orders Placed This Encounter  Procedures  . Urine culture  . POCT Urinalysis Dipstick    Plan:  Continued routine obstetrical care, PN2 today.  POC urine cx today   Return in about 3 weeks (around 02/04/2017) for LROB.

## 2017-01-14 NOTE — Patient Instructions (Addendum)
Third Trimester of Pregnancy The third trimester is from week 28 through week 40 (months 7 through 9). The third trimester is a time when the unborn baby (fetus) is growing rapidly. At the end of the ninth month, the fetus is about 20 inches in length and weighs 6-10 pounds. Body changes during your third trimester Your body will continue to go through many changes during pregnancy. The changes vary from woman to woman. During the third trimester:  Your weight will continue to increase. You can expect to gain 25-35 pounds (11-16 kg) by the end of the pregnancy.  You may begin to get stretch marks on your hips, abdomen, and breasts.  You may urinate more often because the fetus is moving lower into your pelvis and pressing on your bladder.  You may develop or continue to have heartburn. This is caused by increased hormones that slow down muscles in the digestive tract.  You may develop or continue to have constipation because increased hormones slow digestion and cause the muscles that push waste through your intestines to relax.  You may develop hemorrhoids. These are swollen veins (varicose veins) in the rectum that can itch or be painful.  You may develop swollen, bulging veins (varicose veins) in your legs.  You may have increased body aches in the pelvis, back, or thighs. This is due to weight gain and increased hormones that are relaxing your joints.  You may have changes in your hair. These can include thickening of your hair, rapid growth, and changes in texture. Some women also have hair loss during or after pregnancy, or hair that feels dry or thin. Your hair will most likely return to normal after your baby is born.  Your breasts will continue to grow and they will continue to become tender. A yellow fluid (colostrum) may leak from your breasts. This is the first milk you are producing for your baby.  Your belly button may stick out.  You may notice more swelling in your hands,  face, or ankles.  You may have increased tingling or numbness in your hands, arms, and legs. The skin on your belly may also feel numb.  You may feel short of breath because of your expanding uterus.  You may have more problems sleeping. This can be caused by the size of your belly, increased need to urinate, and an increase in your body's metabolism.  You may notice the fetus "dropping," or moving lower in your abdomen (lightening).  You may have increased vaginal discharge.  You may notice your joints feel loose and you may have pain around your pelvic bone. What to expect at prenatal visits You will have prenatal exams every 2 weeks until week 36. Then you will have weekly prenatal exams. During a routine prenatal visit:  You will be weighed to make sure you and the baby are growing normally.  Your blood pressure will be taken.  Your abdomen will be measured to track your baby's growth.  The fetal heartbeat will be listened to.  Any test results from the previous visit will be discussed.  You may have a cervical check near your due date to see if your cervix has softened or thinned (effaced).  You will be tested for Group B streptococcus. This happens between 35 and 37 weeks. Your health care provider may ask you:  What your birth plan is.  How you are feeling.  If you are feeling the baby move.  If you have had any abnormal   symptoms, such as leaking fluid, bleeding, severe headaches, or abdominal cramping.  If you are using any tobacco products, including cigarettes, chewing tobacco, and electronic cigarettes.  If you have any questions. Other tests or screenings that may be performed during your third trimester include:  Blood tests that check for low iron levels (anemia).  Fetal testing to check the health, activity level, and growth of the fetus. Testing is done if you have certain medical conditions or if there are problems during the pregnancy.  Nonstress test  (NST). This test checks the health of your baby to make sure there are no signs of problems, such as the baby not getting enough oxygen. During this test, a belt is placed around your belly. The baby is made to move, and its heart rate is monitored during movement. What is false labor? False labor is a condition in which you feel small, irregular tightenings of the muscles in the womb (contractions) that usually go away with rest, changing position, or drinking water. These are called Braxton Hicks contractions. Contractions may last for hours, days, or even weeks before true labor sets in. If contractions come at regular intervals, become more frequent, increase in intensity, or become painful, you should see your health care provider. What are the signs of labor?  Abdominal cramps.  Regular contractions that start at 10 minutes apart and become stronger and more frequent with time.  Contractions that start on the top of the uterus and spread down to the lower abdomen and back.  Increased pelvic pressure and dull back pain.  A watery or bloody mucus discharge that comes from the vagina.  Leaking of amniotic fluid. This is also known as your "water breaking." It could be a slow trickle or a gush. Let your health care provider know if it has a color or strange odor. If you have any of these signs, call your health care provider right away, even if it is before your due date. Follow these instructions at home: Medicines   Follow your health care provider's instructions regarding medicine use. Specific medicines may be either safe or unsafe to take during pregnancy.  Take a prenatal vitamin that contains at least 600 micrograms (mcg) of folic acid.  If you develop constipation, try taking a stool softener if your health care provider approves. Eating and drinking   Eat a balanced diet that includes fresh fruits and vegetables, whole grains, good sources of protein such as meat, eggs, or tofu,  and low-fat dairy. Your health care provider will help you determine the amount of weight gain that is right for you.  Avoid raw meat and uncooked cheese. These carry germs that can cause birth defects in the baby.  If you have low calcium intake from food, talk to your health care provider about whether you should take a daily calcium supplement.  Eat four or five small meals rather than three large meals a day.  Limit foods that are high in fat and processed sugars, such as fried and sweet foods.  To prevent constipation:  Drink enough fluid to keep your urine clear or pale yellow.  Eat foods that are high in fiber, such as fresh fruits and vegetables, whole grains, and beans. Activity   Exercise only as directed by your health care provider. Most women can continue their usual exercise routine during pregnancy. Try to exercise for 30 minutes at least 5 days a week. Stop exercising if you experience uterine contractions.  Avoid heavy lifting.  lifting.  Do not exercise in extreme heat or humidity, or at high altitudes.  Wear low-heel, comfortable shoes.  Practice good posture.  You may continue to have sex unless your health care provider tells you otherwise. Relieving pain and discomfort   Take frequent breaks and rest with your legs elevated if you have leg cramps or low back pain.  Take warm sitz baths to soothe any pain or discomfort caused by hemorrhoids. Use hemorrhoid cream if your health care provider approves.  Wear a good support bra to prevent discomfort from breast tenderness.  If you develop varicose veins:  Wear support pantyhose or compression stockings as told by your healthcare provider.  Elevate your feet for 15 minutes, 3-4 times a day. Prenatal care   Write down your questions. Take them to your prenatal visits.  Keep all your prenatal visits as told by your health care provider. This is important. Safety   Wear your seat belt  at all times when driving.  Make a list of emergency phone numbers, including numbers for family, friends, the hospital, and police and fire departments. General instructions   Avoid cat litter boxes and soil used by cats. These carry germs that can cause birth defects in the baby. If you have a cat, ask someone to clean the litter box for you.  Do not travel far distances unless it is absolutely necessary and only with the approval of your health care provider.  Do not use hot tubs, steam rooms, or saunas.  Do not drink alcohol.  Do not use any products that contain nicotine or tobacco, such as cigarettes and e-cigarettes. If you need help quitting, ask your health care provider.  Do not use any medicinal herbs or unprescribed drugs. These chemicals affect the formation and growth of the baby.  Do not douche or use tampons or scented sanitary pads.  Do not cross your legs for long periods of time.  To prepare for the arrival of your baby:  Take prenatal classes to understand, practice, and ask questions about labor and delivery.  Make a trial run to the hospital.  Visit the hospital and tour the maternity area.  Arrange for maternity or paternity leave through employers.  Arrange for family and friends to take care of pets while you are in the hospital.  Purchase a rear-facing car seat and make sure you know how to install it in your car.  Pack your hospital bag.  Prepare the baby's nursery. Make sure to remove all pillows and stuffed animals from the baby's crib to prevent suffocation.  Visit your dentist if you have not gone during your pregnancy. Use a soft toothbrush to brush your teeth and be gentle when you floss. Contact a health care provider if:  You are unsure if you are in labor or if your water has broken.  You become dizzy.  You have mild pelvic cramps, pelvic pressure, or nagging pain in your abdominal area.  You have lower back pain.  You have  persistent nausea, vomiting, or diarrhea.  You have an unusual or bad smelling vaginal discharge.  You have pain when you urinate. Get help right away if:  Your water breaks before 37 weeks.  You have regular contractions less than 5 minutes apart before 37 weeks.  You have a fever.  You are leaking fluid from your vagina.  You have spotting or bleeding from your vagina.  You have severe abdominal pain or cramping.  You have rapid weight   weight gain.  You have shortness of breath with chest pain.  You notice sudden or extreme swelling of your face, hands, ankles, feet, or legs.  Your baby makes fewer than 10 movements in 2 hours.  You have severe headaches that do not go away when you take medicine.  You have vision changes. Summary  The third trimester is from week 28 through week 40, months 7 through 9. The third trimester is a time when the unborn baby (fetus) is growing rapidly.  During the third trimester, your discomfort may increase as you and your baby continue to gain weight. You may have abdominal, leg, and back pain, sleeping problems, and an increased need to urinate.  During the third trimester your breasts will keep growing and they will continue to become tender. A yellow fluid (colostrum) may leak from your breasts. This is the first milk you are producing for your baby.  False labor is a condition in which you feel small, irregular tightenings of the muscles in the womb (contractions) that eventually go away. These are called Braxton Hicks contractions. Contractions may last for hours, days, or even weeks before true labor sets in.  Signs of labor can include: abdominal cramps; regular contractions that start at 10 minutes apart and become stronger and more frequent with time; watery or bloody mucus discharge that comes from the vagina; increased pelvic pressure and dull back pain; and leaking of amniotic fluid. This information is not intended to replace advice given  to you by your health care provider. Make sure you discuss any questions you have with your health care provider. Document Released: 10/14/2001 Document Revised: 03/27/2016 Document Reviewed: 12/21/2012 [employer] Interactive Patient Education  2017 [employer]   kinesiology taping for carpal tunnel

## 2017-01-15 LAB — CBC
Hematocrit: 36.9 % (ref 34.0–46.6)
Hemoglobin: 12.4 g/dL (ref 11.1–15.9)
MCH: 29.7 pg (ref 26.6–33.0)
MCHC: 33.6 g/dL (ref 31.5–35.7)
MCV: 89 fL (ref 79–97)
PLATELETS: 195 10*3/uL (ref 150–379)
RBC: 4.17 x10E6/uL (ref 3.77–5.28)
RDW: 14.8 % (ref 12.3–15.4)
WBC: 8.9 10*3/uL (ref 3.4–10.8)

## 2017-01-15 LAB — HIV ANTIBODY (ROUTINE TESTING W REFLEX): HIV Screen 4th Generation wRfx: NONREACTIVE

## 2017-01-15 LAB — ANTIBODY SCREEN: Antibody Screen: NEGATIVE

## 2017-01-15 LAB — RPR: RPR: NONREACTIVE

## 2017-01-15 LAB — GLUCOSE TOLERANCE, 2 HOURS W/ 1HR
GLUCOSE, FASTING: 78 mg/dL (ref 65–91)
Glucose, 1 hour: 127 mg/dL (ref 65–179)
Glucose, 2 hour: 109 mg/dL (ref 65–152)

## 2017-01-16 LAB — URINE CULTURE

## 2017-02-04 ENCOUNTER — Ambulatory Visit (INDEPENDENT_AMBULATORY_CARE_PROVIDER_SITE_OTHER): Payer: BLUE CROSS/BLUE SHIELD | Admitting: Advanced Practice Midwife

## 2017-02-04 ENCOUNTER — Encounter: Payer: Self-pay | Admitting: Advanced Practice Midwife

## 2017-02-04 VITALS — BP 120/70 | HR 78 | Wt 261.0 lb

## 2017-02-04 DIAGNOSIS — Z331 Pregnant state, incidental: Secondary | ICD-10-CM

## 2017-02-04 DIAGNOSIS — Z3483 Encounter for supervision of other normal pregnancy, third trimester: Secondary | ICD-10-CM

## 2017-02-04 DIAGNOSIS — Z1389 Encounter for screening for other disorder: Secondary | ICD-10-CM

## 2017-02-04 LAB — POCT URINALYSIS DIPSTICK
Blood, UA: NEGATIVE
GLUCOSE UA: NEGATIVE
Ketones, UA: NEGATIVE
LEUKOCYTES UA: NEGATIVE
NITRITE UA: NEGATIVE
Protein, UA: NEGATIVE

## 2017-02-04 NOTE — Progress Notes (Signed)
G2P1001 [redacted]w[redacted]d Estimated Date of Delivery: 04/10/17  Blood pressure 120/70, pulse 78, weight 261 lb (118.4 kg), last menstrual period 04/19/2016.   BP weight and urine results all reviewed and noted.  Please refer to the obstetrical flow sheet for the fundal height and fetal heart rate documentation:  Patient reports good fetal movement, denies any bleeding and no rupture of membranes symptoms or regular contractions. Patient is without complaints. All questions were answered.  Orders Placed This Encounter  Procedures  . POCT urinalysis dipstick    Plan:  Continued routine obstetrical care,   Return in about 2 weeks (around 02/18/2017) for LROB.

## 2017-02-04 NOTE — Patient Instructions (Signed)
Third Trimester of Pregnancy The third trimester is from week 28 through week 40 (months 7 through 9). The third trimester is a time when the unborn baby (fetus) is growing rapidly. At the end of the ninth month, the fetus is about 20 inches in length and weighs 6-10 pounds. Body changes during your third trimester Your body will continue to go through many changes during pregnancy. The changes vary from woman to woman. During the third trimester:  Your weight will continue to increase. You can expect to gain 25-35 pounds (11-16 kg) by the end of the pregnancy.  You may begin to get stretch marks on your hips, abdomen, and breasts.  You may urinate more often because the fetus is moving lower into your pelvis and pressing on your bladder.  You may develop or continue to have heartburn. This is caused by increased hormones that slow down muscles in the digestive tract.  You may develop or continue to have constipation because increased hormones slow digestion and cause the muscles that push waste through your intestines to relax.  You may develop hemorrhoids. These are swollen veins (varicose veins) in the rectum that can itch or be painful.  You may develop swollen, bulging veins (varicose veins) in your legs.  You may have increased body aches in the pelvis, back, or thighs. This is due to weight gain and increased hormones that are relaxing your joints.  You may have changes in your hair. These can include thickening of your hair, rapid growth, and changes in texture. Some women also have hair loss during or after pregnancy, or hair that feels dry or thin. Your hair will most likely return to normal after your baby is born.  Your breasts will continue to grow and they will continue to become tender. A yellow fluid (colostrum) may leak from your breasts. This is the first milk you are producing for your baby.  Your belly button may stick out.  You may notice more swelling in your hands,  face, or ankles.  You may have increased tingling or numbness in your hands, arms, and legs. The skin on your belly may also feel numb.  You may feel short of breath because of your expanding uterus.  You may have more problems sleeping. This can be caused by the size of your belly, increased need to urinate, and an increase in your body's metabolism.  You may notice the fetus "dropping," or moving lower in your abdomen (lightening).  You may have increased vaginal discharge.  You may notice your joints feel loose and you may have pain around your pelvic bone.  What to expect at prenatal visits You will have prenatal exams every 2 weeks until week 36. Then you will have weekly prenatal exams. During a routine prenatal visit:  You will be weighed to make sure you and the baby are growing normally.  Your blood pressure will be taken.  Your abdomen will be measured to track your baby's growth.  The fetal heartbeat will be listened to.  Any test results from the previous visit will be discussed.  You may have a cervical check near your due date to see if your cervix has softened or thinned (effaced).  You will be tested for Group B streptococcus. This happens between 35 and 37 weeks.  Your health care provider may ask you:  What your birth plan is.  How you are feeling.  If you are feeling the baby move.  If you have had   any abnormal symptoms, such as leaking fluid, bleeding, severe headaches, or abdominal cramping.  If you are using any tobacco products, including cigarettes, chewing tobacco, and electronic cigarettes.  If you have any questions.  Other tests or screenings that may be performed during your third trimester include:  Blood tests that check for low iron levels (anemia).  Fetal testing to check the health, activity level, and growth of the fetus. Testing is done if you have certain medical conditions or if there are problems during the  pregnancy.  Nonstress test (NST). This test checks the health of your baby to make sure there are no signs of problems, such as the baby not getting enough oxygen. During this test, a belt is placed around your belly. The baby is made to move, and its heart rate is monitored during movement.  What is false labor? False labor is a condition in which you feel small, irregular tightenings of the muscles in the womb (contractions) that usually go away with rest, changing position, or drinking water. These are called Braxton Hicks contractions. Contractions may last for hours, days, or even weeks before true labor sets in. If contractions come at regular intervals, become more frequent, increase in intensity, or become painful, you should see your health care provider. What are the signs of labor?  Abdominal cramps.  Regular contractions that start at 10 minutes apart and become stronger and more frequent with time.  Contractions that start on the top of the uterus and spread down to the lower abdomen and back.  Increased pelvic pressure and dull back pain.  A watery or bloody mucus discharge that comes from the vagina.  Leaking of amniotic fluid. This is also known as your "water breaking." It could be a slow trickle or a gush. Let your health care provider know if it has a color or strange odor. If you have any of these signs, call your health care provider right away, even if it is before your due date. Follow these instructions at home: Medicines  Follow your health care provider's instructions regarding medicine use. Specific medicines may be either safe or unsafe to take during pregnancy.  Take a prenatal vitamin that contains at least 600 micrograms (mcg) of folic acid.  If you develop constipation, try taking a stool softener if your health care provider approves. Eating and drinking  Eat a balanced diet that includes fresh fruits and vegetables, whole grains, good sources of protein  such as meat, eggs, or tofu, and low-fat dairy. Your health care provider will help you determine the amount of weight gain that is right for you.  Avoid raw meat and uncooked cheese. These carry germs that can cause birth defects in the baby.  If you have low calcium intake from food, talk to your health care provider about whether you should take a daily calcium supplement.  Eat four or five small meals rather than three large meals a day.  Limit foods that are high in fat and processed sugars, such as fried and sweet foods.  To prevent constipation: ? Drink enough fluid to keep your urine clear or pale yellow. ? Eat foods that are high in fiber, such as fresh fruits and vegetables, whole grains, and beans. Activity  Exercise only as directed by your health care provider. Most women can continue their usual exercise routine during pregnancy. Try to exercise for 30 minutes at least 5 days a week. Stop exercising if you experience uterine contractions.  Avoid heavy   lifting.  Do not exercise in extreme heat or humidity, or at high altitudes.  Wear low-heel, comfortable shoes.  Practice good posture.  You may continue to have sex unless your health care provider tells you otherwise. Relieving pain and discomfort  Take frequent breaks and rest with your legs elevated if you have leg cramps or low back pain.  Take warm sitz baths to soothe any pain or discomfort caused by hemorrhoids. Use hemorrhoid cream if your health care provider approves.  Wear a good support bra to prevent discomfort from breast tenderness.  If you develop varicose veins: ? Wear support pantyhose or compression stockings as told by your healthcare provider. ? Elevate your feet for 15 minutes, 3-4 times a day. Prenatal care  Write down your questions. Take them to your prenatal visits.  Keep all your prenatal visits as told by your health care provider. This is important. Safety  Wear your seat belt at  all times when driving.  Make a list of emergency phone numbers, including numbers for family, friends, the hospital, and police and fire departments. General instructions  Avoid cat litter boxes and soil used by cats. These carry germs that can cause birth defects in the baby. If you have a cat, ask someone to clean the litter box for you.  Do not travel far distances unless it is absolutely necessary and only with the approval of your health care provider.  Do not use hot tubs, steam rooms, or saunas.  Do not drink alcohol.  Do not use any products that contain nicotine or tobacco, such as cigarettes and e-cigarettes. If you need help quitting, ask your health care provider.  Do not use any medicinal herbs or unprescribed drugs. These chemicals affect the formation and growth of the baby.  Do not douche or use tampons or scented sanitary pads.  Do not cross your legs for long periods of time.  To prepare for the arrival of your baby: ? Take prenatal classes to understand, practice, and ask questions about labor and delivery. ? Make a trial run to the hospital. ? Visit the hospital and tour the maternity area. ? Arrange for maternity or paternity leave through employers. ? Arrange for family and friends to take care of pets while you are in the hospital. ? Purchase a rear-facing car seat and make sure you know how to install it in your car. ? Pack your hospital bag. ? Prepare the baby's nursery. Make sure to remove all pillows and stuffed animals from the baby's crib to prevent suffocation.  Visit your dentist if you have not gone during your pregnancy. Use a soft toothbrush to brush your teeth and be gentle when you floss. Contact a health care provider if:  You are unsure if you are in labor or if your water has broken.  You become dizzy.  You have mild pelvic cramps, pelvic pressure, or nagging pain in your abdominal area.  You have lower back pain.  You have persistent  nausea, vomiting, or diarrhea.  You have an unusual or bad smelling vaginal discharge.  You have pain when you urinate. Get help right away if:  Your water breaks before 37 weeks.  You have regular contractions less than 5 minutes apart before 37 weeks.  You have a fever.  You are leaking fluid from your vagina.  You have spotting or bleeding from your vagina.  You have severe abdominal pain or cramping.  You have rapid weight loss or weight gain.    You have shortness of breath with chest pain.  You notice sudden or extreme swelling of your face, hands, ankles, feet, or legs.  Your baby makes fewer than 10 movements in 2 hours.  You have severe headaches that do not go away when you take medicine.  You have vision changes. Summary  The third trimester is from week 28 through week 40, months 7 through 9. The third trimester is a time when the unborn baby (fetus) is growing rapidly.  During the third trimester, your discomfort may increase as you and your baby continue to gain weight. You may have abdominal, leg, and back pain, sleeping problems, and an increased need to urinate.  During the third trimester your breasts will keep growing and they will continue to become tender. A yellow fluid (colostrum) may leak from your breasts. This is the first milk you are producing for your baby.  False labor is a condition in which you feel small, irregular tightenings of the muscles in the womb (contractions) that eventually go away. These are called Braxton Hicks contractions. Contractions may last for hours, days, or even weeks before true labor sets in.  Signs of labor can include: abdominal cramps; regular contractions that start at 10 minutes apart and become stronger and more frequent with time; watery or bloody mucus discharge that comes from the vagina; increased pelvic pressure and dull back pain; and leaking of amniotic fluid. This information is not intended to replace advice  given to you by your health care provider. Make sure you discuss any questions you have with your health care provider. Document Released: 10/14/2001 Document Revised: 03/27/2016 Document Reviewed: 12/21/2012 Elsevier Interactive Patient Education  2017 [employer]  

## 2017-02-18 ENCOUNTER — Ambulatory Visit (INDEPENDENT_AMBULATORY_CARE_PROVIDER_SITE_OTHER): Payer: BLUE CROSS/BLUE SHIELD | Admitting: Obstetrics and Gynecology

## 2017-02-18 ENCOUNTER — Encounter: Payer: Self-pay | Admitting: Obstetrics and Gynecology

## 2017-02-18 VITALS — BP 130/80 | HR 60 | Wt 259.0 lb

## 2017-02-18 DIAGNOSIS — Z1389 Encounter for screening for other disorder: Secondary | ICD-10-CM

## 2017-02-18 DIAGNOSIS — Z331 Pregnant state, incidental: Secondary | ICD-10-CM

## 2017-02-18 DIAGNOSIS — Z3483 Encounter for supervision of other normal pregnancy, third trimester: Secondary | ICD-10-CM

## 2017-02-18 DIAGNOSIS — Z98891 History of uterine scar from previous surgery: Secondary | ICD-10-CM

## 2017-02-18 LAB — POCT URINALYSIS DIPSTICK
GLUCOSE UA: NEGATIVE
KETONES UA: NEGATIVE
LEUKOCYTES UA: NEGATIVE
NITRITE UA: NEGATIVE
Protein, UA: NEGATIVE
RBC UA: NEGATIVE

## 2017-02-18 NOTE — Progress Notes (Signed)
[id number]  Estimated Date of Delivery: 04/10/17 [facility] [redacted]w[redacted]d  Chief Complaint  Patient presents with  . Routine Prenatal Visit  ____  Patient complaints: none at this time. Patient reports   good fetal movement,                           denies any bleeding, rupture of membranes,or regular contractions.  Blood pressure 130/80, pulse 60, weight 259 lb (117.5 kg), last menstrual period 04/19/2016.   Urine results: negative refer to the ob flow sheet for FH and FHR, ,                          Physical Examination: General appearance - alert, well appearing, and in no distress                                      Abdomen - FH 34 ,                                                         -FHR 147                                                         soft, nontender, nondistended, no masses or organomegaly                                      Pelvic - examination not indicated                                            Questions were answered. Assessment: LROB G2P1001 @ [redacted]w[redacted]d,                         Prior cesarean for scheduled repeat  Plan:  Continued routine obstetrical care, has schedule c-section for 04/03/17  F/u in 2 weeks for LROB   By signing my name below, I, Sonum Patel, attest that this documentation has been prepared under the direction and in the presence of Tilda Burrow, MD. Electronically Signed: Sonum Patel, Neurosurgeon. 02/18/17. 9:27 AM.  I personally performed the services described in this documentation, which was SCRIBED in my presence. The recorded information has been reviewed and considered accurate. It has been edited as necessary during review. Tilda Burrow, MD

## 2017-03-02 ENCOUNTER — Telehealth: Payer: Self-pay | Admitting: Obstetrics & Gynecology

## 2017-03-02 NOTE — Telephone Encounter (Signed)
Patient called with complaints of a fever and body aches since her daughter had a fever a couple of days ago.  She has been taking extra strength Tylenol every 6-8 hours. Baby is moving, no leaking or bleeding. Encouraged to push fluids and continue taking Tylenol to keep fever down and rest. Advised to call back if symptoms worsen. Pt verbalized understanding.

## 2017-03-04 DIAGNOSIS — Z3482 Encounter for supervision of other normal pregnancy, second trimester: Secondary | ICD-10-CM | POA: Diagnosis not present

## 2017-03-04 DIAGNOSIS — Z3483 Encounter for supervision of other normal pregnancy, third trimester: Secondary | ICD-10-CM | POA: Diagnosis not present

## 2017-03-05 ENCOUNTER — Ambulatory Visit (INDEPENDENT_AMBULATORY_CARE_PROVIDER_SITE_OTHER): Payer: BLUE CROSS/BLUE SHIELD | Admitting: Obstetrics & Gynecology

## 2017-03-05 ENCOUNTER — Encounter: Payer: Self-pay | Admitting: Obstetrics & Gynecology

## 2017-03-05 VITALS — BP 132/92 | HR 92 | Wt 261.0 lb

## 2017-03-05 DIAGNOSIS — Z331 Pregnant state, incidental: Secondary | ICD-10-CM

## 2017-03-05 DIAGNOSIS — O0993 Supervision of high risk pregnancy, unspecified, third trimester: Secondary | ICD-10-CM

## 2017-03-05 DIAGNOSIS — Z1389 Encounter for screening for other disorder: Secondary | ICD-10-CM

## 2017-03-05 DIAGNOSIS — O133 Gestational [pregnancy-induced] hypertension without significant proteinuria, third trimester: Secondary | ICD-10-CM

## 2017-03-05 LAB — POCT URINALYSIS DIPSTICK
Blood, UA: NEGATIVE
Glucose, UA: NEGATIVE
Ketones, UA: NEGATIVE
LEUKOCYTES UA: NEGATIVE
NITRITE UA: NEGATIVE
PROTEIN UA: NEGATIVE

## 2017-03-05 NOTE — Progress Notes (Signed)
Fetal Surveillance Testing today:  FHR 145   High Risk Pregnancy Diagnosis(es):   Borderline gestational hypertension  G2P1001 5069w6d Estimated Date of Delivery: 04/10/17  Blood pressure (!) 132/92, pulse 92, weight 261 lb (118.4 kg), last menstrual period 04/19/2016.  Urinalysis: Negative   HPI: The patient is being seen today for ongoing management of as above. Today she reports no complaints   BP weight and urine results all reviewed and noted. Patient reports good fetal movement, denies any bleeding and no rupture of membranes symptoms or regular contractions.  Fundal Height:  36 Fetal Heart rate:  145 Edema:  1+  Patient is without complaints other than noted in her HPI. All questions were answered.  All lab and sonogram results have been reviewed. Comments:    Assessment:  1.  Pregnancy at 2669w6d,  Estimated Date of Delivery: 04/10/17 :                          2.  Borderline gestational hypertension                        3.  Previous C section  Medication(s) Plans:    Treatment Plan:  NST, HROB in 4 days  Return in about 4 days (around 03/09/2017) for NST, HROB, with Dr Despina HiddenEure. for appointment for high risk OB care  No orders of the defined types were placed in this encounter.  Orders Placed This Encounter  Procedures  . POCT urinalysis dipstick

## 2017-03-09 ENCOUNTER — Encounter: Payer: Self-pay | Admitting: Obstetrics & Gynecology

## 2017-03-09 ENCOUNTER — Ambulatory Visit (INDEPENDENT_AMBULATORY_CARE_PROVIDER_SITE_OTHER): Payer: BLUE CROSS/BLUE SHIELD | Admitting: Obstetrics & Gynecology

## 2017-03-09 VITALS — BP 140/80 | HR 89 | Wt 266.0 lb

## 2017-03-09 DIAGNOSIS — Z331 Pregnant state, incidental: Secondary | ICD-10-CM

## 2017-03-09 DIAGNOSIS — O0993 Supervision of high risk pregnancy, unspecified, third trimester: Secondary | ICD-10-CM

## 2017-03-09 DIAGNOSIS — Z1389 Encounter for screening for other disorder: Secondary | ICD-10-CM

## 2017-03-09 DIAGNOSIS — O133 Gestational [pregnancy-induced] hypertension without significant proteinuria, third trimester: Secondary | ICD-10-CM

## 2017-03-09 LAB — POCT URINALYSIS DIPSTICK
Glucose, UA: NEGATIVE
Ketones, UA: NEGATIVE
Leukocytes, UA: NEGATIVE
Nitrite, UA: NEGATIVE
PROTEIN UA: NEGATIVE
RBC UA: NEGATIVE

## 2017-03-09 NOTE — Progress Notes (Signed)
Fetal Surveillance Testing today:  Reactive NST   High Risk Pregnancy Diagnosis(es):   Borderline gestational hypertension  G2P1001 6660w3d Estimated Date of Delivery: 04/10/17  Blood pressure 140/80, pulse 89, weight 266 lb (120.7 kg), last menstrual period 04/19/2016.  Urinalysis: Negative   HPI: The patient is being seen today for ongoing management of as above. Today she reports no complaints, BP at home look better than here, she will bring cuff in to be calibrated   BP weight and urine results all reviewed and noted. Patient reports good fetal movement, denies any bleeding and no rupture of membranes symptoms or regular contractions.  Fundal Height:  37 Fetal Heart rate:  145 Edema:  1+  Patient is without complaints other than noted in her HPI. All questions were answered.  All lab and sonogram results have been reviewed. Comments:    Assessment:  1.  Pregnancy at 7560w3d,  Estimated Date of Delivery: 04/10/17 :                          2.  Borderline gestational hypertensin                        3.  Previous C section  Medication(s) Plans:  none  Treatment Plan:  Continue twice weekly assessments NST for now, not definitivly GHTN but with age and IVF certainly at increased risk, has sort of borderline BP anyway  Return in about 3 days (around 03/12/2017) for NST, HROB. for appointment for high risk OB care  Meds ordered this encounter  Medications  . acetaminophen (TYLENOL) 500 MG tablet    Sig: Take 1,000 mg by mouth as needed.  . Calcium Carbonate Antacid (TUMS PO)    Sig: Take by mouth as needed.   Orders Placed This Encounter  Procedures  . POCT Urinalysis Dipstick

## 2017-03-12 ENCOUNTER — Ambulatory Visit (INDEPENDENT_AMBULATORY_CARE_PROVIDER_SITE_OTHER): Payer: BLUE CROSS/BLUE SHIELD | Admitting: Obstetrics & Gynecology

## 2017-03-12 ENCOUNTER — Encounter: Payer: Self-pay | Admitting: Obstetrics & Gynecology

## 2017-03-12 VITALS — BP 100/80 | HR 82 | Wt 267.4 lb

## 2017-03-12 DIAGNOSIS — O0993 Supervision of high risk pregnancy, unspecified, third trimester: Secondary | ICD-10-CM

## 2017-03-12 DIAGNOSIS — O133 Gestational [pregnancy-induced] hypertension without significant proteinuria, third trimester: Secondary | ICD-10-CM

## 2017-03-12 DIAGNOSIS — Z331 Pregnant state, incidental: Secondary | ICD-10-CM

## 2017-03-12 DIAGNOSIS — Z1389 Encounter for screening for other disorder: Secondary | ICD-10-CM

## 2017-03-12 LAB — POCT URINALYSIS DIPSTICK
GLUCOSE UA: NEGATIVE
KETONES UA: NEGATIVE
LEUKOCYTES UA: NEGATIVE
Nitrite, UA: NEGATIVE
Protein, UA: NEGATIVE
RBC UA: NEGATIVE

## 2017-03-12 NOTE — Progress Notes (Signed)
Fetal Surveillance Testing today:  Reactive NST   High Risk Pregnancy Diagnosis(es):   Borderline Gestational Hypertension  G2P1001 4157w6d Estimated Date of Delivery: 04/10/17  Blood pressure 100/80, pulse 82, weight 267 lb 6.4 oz (121.3 kg), last menstrual period 04/19/2016.  Urinalysis: Negative   HPI: The patient is being seen today for ongoing management of as above. Today she reports BP at home 130/80s range, nothing in the 140s or 90s   BP weight and urine results all reviewed and noted. Patient reports good fetal movement, denies any bleeding and no rupture of membranes symptoms or regular contractions.  Fundal Height:  38 Fetal Heart rate:  135 Edema:  1+  Patient is without complaints other than noted in her HPI. All questions were answered.  All lab and sonogram results have been reviewed. Comments:    Assessment:  1.  Pregnancy at 8957w6d,  Estimated Date of Delivery: 04/10/17 :                          2.  Gestational Hypertension, borderling                        3.    Medication(s) Plans:  none  Treatment Plan:  BP have actually improved and at home none [facility] Will decrease surveillance for now and the patient will contact me if her BP worsen NST next week For now will keep planned delivery as 04/03/2017  Return in about 1 week (around 03/19/2017) for NST, HROB, with Dr Despina HiddenEure. for appointment for high risk OB care  No orders of the defined types were placed in this encounter.  Orders Placed This Encounter  Procedures  . POCT urinalysis dipstick

## 2017-03-19 ENCOUNTER — Encounter: Payer: Self-pay | Admitting: Obstetrics & Gynecology

## 2017-03-19 ENCOUNTER — Ambulatory Visit (INDEPENDENT_AMBULATORY_CARE_PROVIDER_SITE_OTHER): Payer: BLUE CROSS/BLUE SHIELD | Admitting: Obstetrics & Gynecology

## 2017-03-19 VITALS — BP 132/72 | HR 76 | Wt 272.0 lb

## 2017-03-19 DIAGNOSIS — O133 Gestational [pregnancy-induced] hypertension without significant proteinuria, third trimester: Secondary | ICD-10-CM

## 2017-03-19 DIAGNOSIS — Z331 Pregnant state, incidental: Secondary | ICD-10-CM

## 2017-03-19 DIAGNOSIS — O0993 Supervision of high risk pregnancy, unspecified, third trimester: Secondary | ICD-10-CM

## 2017-03-19 DIAGNOSIS — Z1389 Encounter for screening for other disorder: Secondary | ICD-10-CM

## 2017-03-19 LAB — POCT URINALYSIS DIPSTICK
Glucose, UA: NEGATIVE
Ketones, UA: NEGATIVE
Leukocytes, UA: NEGATIVE
Nitrite, UA: NEGATIVE
PROTEIN UA: NEGATIVE
RBC UA: NEGATIVE

## 2017-03-19 NOTE — Progress Notes (Signed)
Fetal Surveillance Testing today:  Reactive NST   High Risk Pregnancy Diagnosis(es):   Borderline Gestational Hypertension, BP have been good  G2P1001 3737w6d Estimated Date of Delivery: 04/10/17  Blood pressure 132/72, pulse 76, weight 272 lb (123.4 kg), last menstrual period 04/19/2016.  Urinalysis: Negative   HPI: The patient is being seen today for ongoing management of as above. Today she reports no problems, BP good at home   BP weight and urine results all reviewed and noted. Patient reports good fetal movement, denies any bleeding and no rupture of membranes symptoms or regular contractions.  Fundal Height:  39 Fetal Heart rate:  135 Edema:  2+  Patient is without complaints other than noted in her HPI. All questions were answered.  All lab and sonogram results have been reviewed. Comments:    Assessment:  1.  Pregnancy at 6537w6d,  Estimated Date of Delivery: 04/10/17 :                          2.  Borderline Gestational Hypertension                        3.    Medication(s) Plans:  none  Treatment Plan:  BPP sonogram 1 week with Dopplers  Return in about 1 week (around 03/26/2017) for BPP/sono, HROB, with Dr Despina HiddenEure. for appointment for high risk OB care  No orders of the defined types were placed in this encounter.  Orders Placed This Encounter  Procedures  . Strep Gp B NAA+Rflx  . GC/Chlamydia Probe Amp  . US FETAL BPP W/NONSTRESS  . US UA Cord Doppler  . POCT urinalysis dipstick

## 2017-03-19 NOTE — Addendum Note (Signed)
Addended by: Gaylyn RongEVANS, Saxon Crosby A on: 03/19/2017 10:03 AM   Modules accepted: Orders

## 2017-03-21 LAB — GC/CHLAMYDIA PROBE AMP
Chlamydia trachomatis, NAA: NEGATIVE
NEISSERIA GONORRHOEAE BY PCR: NEGATIVE

## 2017-03-24 ENCOUNTER — Encounter: Payer: Self-pay | Admitting: Obstetrics & Gynecology

## 2017-03-26 ENCOUNTER — Ambulatory Visit (INDEPENDENT_AMBULATORY_CARE_PROVIDER_SITE_OTHER): Payer: BLUE CROSS/BLUE SHIELD | Admitting: Obstetrics & Gynecology

## 2017-03-26 ENCOUNTER — Encounter: Payer: Self-pay | Admitting: Obstetrics & Gynecology

## 2017-03-26 ENCOUNTER — Ambulatory Visit (INDEPENDENT_AMBULATORY_CARE_PROVIDER_SITE_OTHER): Payer: BLUE CROSS/BLUE SHIELD

## 2017-03-26 VITALS — BP 122/84 | HR 108 | Wt 280.0 lb

## 2017-03-26 DIAGNOSIS — O0993 Supervision of high risk pregnancy, unspecified, third trimester: Secondary | ICD-10-CM

## 2017-03-26 DIAGNOSIS — O133 Gestational [pregnancy-induced] hypertension without significant proteinuria, third trimester: Secondary | ICD-10-CM

## 2017-03-26 DIAGNOSIS — Z3403 Encounter for supervision of normal first pregnancy, third trimester: Secondary | ICD-10-CM

## 2017-03-26 DIAGNOSIS — R030 Elevated blood-pressure reading, without diagnosis of hypertension: Secondary | ICD-10-CM

## 2017-03-26 DIAGNOSIS — Z331 Pregnant state, incidental: Secondary | ICD-10-CM

## 2017-03-26 DIAGNOSIS — Z1389 Encounter for screening for other disorder: Secondary | ICD-10-CM

## 2017-03-26 LAB — POCT URINALYSIS DIPSTICK
GLUCOSE UA: NEGATIVE
Leukocytes, UA: NEGATIVE
NITRITE UA: NEGATIVE
RBC UA: NEGATIVE

## 2017-03-26 NOTE — Progress Notes (Signed)
Fetal Surveillance Testing today:  BPP 8/8   High Risk Pregnancy Diagnosis(es):   Gestational Hypertension, borderline  G2P1001 5093w6d Estimated Date of Delivery: 04/10/17  Blood pressure 122/84, pulse (!) 108, weight 280 lb (127 kg), last menstrual period 04/19/2016.  Urinalysis: Negative   HPI: The patient is being seen today for ongoing management of as above. Today she reports increasing lower extremity swelling   BP weight and urine results all reviewed and noted. Patient reports good fetal movement, denies any bleeding and no rupture of membranes symptoms or regular contractions.  Fundal Height:  na Fetal Heart rate:  140 Edema:  3+  Patient is without complaints other than noted in her HPI. All questions were answered.  All lab and sonogram results have been reviewed. Comments:    Assessment:  1.  Pregnancy at 3393w6d,  Estimated Date of Delivery: 04/10/17 :                          2.  Gestational Hypertension, borderline                        3.  Previous Casearean section  Medication(s) Plans:  none  Treatment Plan:  NST next week and repeat C section  Return in about 5 days (around 03/31/2017) for NST, HROB, with Dr Despina HiddenEure. for appointment for high risk OB care  No orders of the defined types were placed in this encounter.  Orders Placed This Encounter  Procedures  . POCT urinalysis dipstick

## 2017-03-26 NOTE — Progress Notes (Signed)
US 37+6 wks,cephalic,fhr 140 bpm,BPP 8/8,RI .45,.40 4.9%,AFI 12 cm,normal ovaries bilat,post pl gr 3

## 2017-03-27 ENCOUNTER — Emergency Department (HOSPITAL_COMMUNITY): Payer: BLUE CROSS/BLUE SHIELD

## 2017-03-27 ENCOUNTER — Ambulatory Visit (HOSPITAL_COMMUNITY): Payer: BLUE CROSS/BLUE SHIELD

## 2017-03-27 ENCOUNTER — Encounter (HOSPITAL_COMMUNITY): Payer: Self-pay

## 2017-03-27 ENCOUNTER — Other Ambulatory Visit: Payer: Self-pay | Admitting: Obstetrics & Gynecology

## 2017-03-27 ENCOUNTER — Emergency Department (HOSPITAL_COMMUNITY)
Admission: EM | Admit: 2017-03-27 | Discharge: 2017-03-27 | Disposition: A | Discharge status: Home / Self Care | Payer: BLUE CROSS/BLUE SHIELD | Attending: Emergency Medicine | Admitting: Emergency Medicine

## 2017-03-27 DIAGNOSIS — Z3A38 38 weeks gestation of pregnancy: Secondary | ICD-10-CM | POA: Insufficient documentation

## 2017-03-27 DIAGNOSIS — O99713 Diseases of the skin and subcutaneous tissue complicating pregnancy, third trimester: Secondary | ICD-10-CM | POA: Diagnosis not present

## 2017-03-27 DIAGNOSIS — Z7982 Long term (current) use of aspirin: Secondary | ICD-10-CM | POA: Diagnosis not present

## 2017-03-27 DIAGNOSIS — O2230 Deep phlebothrombosis in pregnancy, unspecified trimester: Secondary | ICD-10-CM

## 2017-03-27 DIAGNOSIS — M7989 Other specified soft tissue disorders: Secondary | ICD-10-CM | POA: Diagnosis not present

## 2017-03-27 DIAGNOSIS — L03115 Cellulitis of right lower limb: Secondary | ICD-10-CM | POA: Insufficient documentation

## 2017-03-27 LAB — STREP GP B SUSCEPTIBILITY

## 2017-03-27 LAB — STREP GP B NAA+RFLX: Strep Gp B NAA+Rflx: POSITIVE — AB

## 2017-03-27 MED ORDER — CLINDAMYCIN HCL 300 MG PO CAPS: 300.0000 mg | ORAL_CAPSULE | Freq: Three times a day (TID) | ORAL | 1 refills | Status: DC

## 2017-03-27 MED ORDER — CLINDAMYCIN HCL 150 MG PO CAPS
600.0000 mg | ORAL_CAPSULE | Freq: Once | ORAL | Status: AC
  Administered 2017-03-27: 600 mg via ORAL
  Filled 2017-03-27: qty 4

## 2017-03-27 NOTE — Progress Notes (Signed)
Received call about this 36 yo G2P1 @[redacted]  wks GA sent from OB/GYN office for R/O DVT.

## 2017-03-27 NOTE — ED Notes (Signed)
EFM off for pt to go to radiology.

## 2017-03-27 NOTE — Discharge Instructions (Signed)
Take 2 capsules 3 times a day for 1 day; then 1 capsule 3 times a day. Keep area scrupulously clean. Follow-up with your OB/GYN doctor.

## 2017-03-27 NOTE — ED Provider Notes (Signed)
[facility] Provider Note   CSN: [id number] Arrival date & time: 03/27/17  1812   By signing my name below, I, Thelma Barge, attest that this documentation has been prepared under the direction and in the presence of Donnetta Hutching, MD. Electronically Signed: Thelma Barge, Scribe. 03/27/17. 9:18 PM.  History   Chief Complaint Chief Complaint  Patient presents with  . Leg Swelling   The history is provided by the patient. No language interpreter was used.     HPI Comments: Sonya Chapman is a 36 y.o. female who is [redacted] weeks pregnant and G2P1 who presents to the Emergency Department complaining of constant, right-sided medial thigh discomfort for 4-5 days. She feels fine otherwise. She denies fever, chills, CP, dysuria, or vaginal bleeding. She states she is borderline hypertensive.    Past Medical History:  Diagnosis Date  . Endometrioma     Patient Active Problem List   Diagnosis Date Noted  . H/O cesarean section desires repeat 02/18/2017  . Borderline hypertension 10/02/2016  . Supervision of normal pregnancy 09/18/2016  . Encounter for wound care 01/11/2016  . Obesity 10/16/2014  . Abnormal Pap smear of cervix, ASCUS-09/2002 10/16/2014    Past Surgical History:  Procedure Laterality Date  . CESAREAN SECTION    . EXCISION OF ENDOMETRIOMA  12/26/2015   Procedure: RESECTION OF PERIUMBILICAL ENDOMETRIOMA;  Surgeon: Lazaro Arms, MD;  Location: [facility];  Service: Gynecology;;  . [name] TOOTH EXTRACTION      OB History    Gravida Para Term Preterm AB Living   2 1 1     1    SAB TAB Ectopic Multiple Live Births           1      Obstetric Comments   IVF pregnancy,incision abscess       Home Medications    Prior to Admission medications   Medication Sig Start Date End Date Taking? Authorizing Provider  acetaminophen (TYLENOL) 500 MG tablet Take 1,000 mg by mouth every 8 (eight) hours as needed for mild pain or headache.    Yes [provider]  aspirin  EC 81 MG tablet Take 81 mg by mouth daily.   Yes [provider]  Calcium Carbonate Antacid (TUMS PO) Take 1-2 tablets by mouth 2 (two) times daily as needed (indigestion Atilano Ina).    Yes [provider]  Prenatal Vit-Fe Fumarate-FA (MULTIVITAMIN-PRENATAL) 27-0.8 MG TABS tablet Take 1 tablet by mouth daily at 12 noon.   Yes [provider]  clindamycin (CLEOCIN) 300 MG capsule Take 1 capsule (300 mg total) by mouth 3 (three) times daily. 03/27/17   Donnetta Hutching, MD    Family History Family History  Problem Relation Age of Onset  . Hypertension Mother   . Thyroid disease Mother   . Hypertension Father     Social History Social History  Substance Use Topics  . Smoking status: Never Smoker  . Smokeless tobacco: Never Used  . Alcohol use No     Comment: occassional     Allergies   Penicillins   Review of Systems Review of Systems  Constitutional: Negative for chills and fever.  Cardiovascular: Negative for chest pain.  Genitourinary: Negative for dysuria.  Skin: Positive for color change and rash.  All other systems reviewed and are negative.    Physical Exam Updated Vital Signs BP (!) 149/88 (BP Location: Right Arm)   Pulse (!) 120   Temp 98.6 F (37 C) (Oral)   Resp 20  Ht 5\' 9"  (1.753 m)   Wt 127 kg (280 lb)   LMP 04/19/2016   SpO2 100%   BMI 41.35 kg/m   Physical Exam  Constitutional: She is oriented to person, place, and time. She appears well-developed and well-nourished.  HENT:  Head: Normocephalic and atraumatic.  Eyes: Conjunctivae are normal.  Neck: Neck supple.  Cardiovascular: Normal rate and regular rhythm.   Pulmonary/Chest: Effort normal and breath sounds normal.  Abdominal: Soft. Bowel sounds are normal.  Musculoskeletal: Normal range of motion. She exhibits tenderness.  Right leg: erythema and tenderness on the distal medial aspect of thigh  Neurological: She is alert and oriented to person, place, and time.    Skin: Skin is warm and dry.  Psychiatric: She has a normal mood and affect. Her behavior is normal.  Nursing note and vitals reviewed.    ED Treatments / Results  DIAGNOSTIC STUDIES: Oxygen Saturation is 100% on RA, normal by my interpretation.    COORDINATION OF CARE: 8:57 PM Discussed treatment plan with pt at bedside and pt agreed to plan. Discuss with Dr. Despina Hidden. Start with CAbx (600 mg 3xday for 1 day, then after that 300mg  3x a day.   Labs (all labs ordered are listed, but only abnormal results are displayed) Labs Reviewed - No data to display  EKG  EKG Interpretation None       Radiology US Venous Img Lower Unilateral Right  Result Date: 03/27/2017 CLINICAL DATA:  Acute onset of right thigh and popliteal fossa swelling. Initial encounter. EXAM: RIGHT LOWER EXTREMITY VENOUS DOPPLER ULTRASOUND TECHNIQUE: Gray-scale sonography with graded compression, as well as color Doppler and duplex ultrasound were performed to evaluate the lower extremity deep venous systems from the level of the common femoral vein and including the common femoral, femoral, profunda femoral, popliteal and calf veins including the posterior tibial, peroneal and gastrocnemius veins when visible. The superficial great saphenous vein was also interrogated. Spectral Doppler was utilized to evaluate flow at rest and with distal augmentation maneuvers in the common femoral, femoral and popliteal veins. COMPARISON:  None. FINDINGS: Contralateral Common Femoral Vein: Respiratory phasicity is normal and symmetric with the symptomatic side. No evidence of thrombus. Normal compressibility. Common Femoral Vein: No evidence of thrombus. Normal compressibility, respiratory phasicity and response to augmentation. Saphenofemoral Junction: No evidence of thrombus. Normal compressibility and flow on color Doppler imaging. Profunda Femoral Vein: No evidence of thrombus. Normal compressibility and flow on color Doppler imaging.  Femoral Vein: No evidence of thrombus. Normal compressibility, respiratory phasicity and response to augmentation. Popliteal Vein: No evidence of thrombus. Normal compressibility, respiratory phasicity and response to augmentation. Calf Veins: No evidence of thrombus. Normal compressibility and flow on color Doppler imaging. Superficial Great Saphenous Vein: No evidence of thrombus. Normal compressibility and flow on color Doppler imaging. Venous Reflux:  None. Other Findings: Evaluation is suboptimal due to the patient's habitus and overlying soft tissue edema. Soft tissue edema is noted at the right medial thigh and calf. Incidental note is made of a small 1.7 x 0.9 x 1.4 cm simple appearing fluid collection at the medial right thigh. No associated blood flow is seen. IMPRESSION: 1. No evidence of DVT within the right lower extremity. 2. Small 1.7 cm simple appearing fluid collection at the medial right thigh, without associated blood flow, of uncertain significance. Diffuse soft tissue edema extending along the right medial thigh and calf. Given overlying erythema, this is concerning for cellulitis. No definite abscess seen at this time. Electronically Signed  By: Roanna Raider M.D.   On: 03/27/2017 20:24   [place] Ua Cord Doppler  Result Date: 03/26/2017 FOLLOW UP SONOGRAM Blair Mesina is in the office for a follow up sonogram for BPP and cord doppler. She is a 36 y.o. year old G2P1001 with Estimated Date of Delivery: 04/10/17 by date of conception now at  [redacted]w[redacted]d weeks gestation. Thus far the pregnancy has been complicated by borderline HTN,AMA. GESTATION: SINGLETON PRESENTATION: cephalic FETAL ACTIVITY:          Heart rate         140          The fetus is active. AMNIOTIC FLUID: The amniotic fluid volume is  normal, 12 cm. PLACENTA LOCALIZATION:  posterior GRADE 3 ADNEXA: The ovaries are normal. Previous Scans:2 BIOPHYSCIAL PROFILE:                                                                                                       COMMENTS GROSS BODY MOVEMENT                 2  TONE                2  RESPIRATIONS                2  AMNIOTIC FLUID                2                                                          SCORE:  8/8 (Note: NST was not performed as part of this antepartum testing) DOPPLER FLOW STUDIES: UMBILICAL ARTERY RI RATIOS:   0.45, 0.40  4.9% ANATOMICAL SURVEY                                                                            COMMENTS CEREBRAL VENTRICLES yes normal  CHOROID PLEXUS yes normal                          FACIAL PROFILE yes normal  4 CHAMBERED HEART yes normal  OUTFLOW TRACTS yes normal  DIAPHRAGM yes normal  STOMACH yes normal  RENAL REGION yes normal  BLADDER yes normal      3 VESSEL CORD yes normal              GENITALIA yes normal female     SUSPECTED ABNORMALITIES:  no QUALITY OF SCAN: satisfactory TECHNICIAN COMMENTS: [place] 37+6 wks,cephalic,fhr 140 bpm,BPP 8/8,RI .45,.40 4.9%,AFI 12 cm,normal ovaries bilat,post pl gr 3  A copy of this report including all images has been saved and backed up to a second source for retrieval if needed. All measures and details of the anatomical scan, placentation, fluid volume and pelvic anatomy are contained in that report. Amber Flora LippsJ Carl 03/26/2017 9:12 AM Clinical Impression and recommendations: I have reviewed the sonogram results above, combined with the patient's current clinical course, below are my impressions and any appropriate recommendations for management based on the sonographic findings. 1.  G2P1001 Estimated Date of Delivery: 04/10/17 by serial sonographic evaluations 2.  Fetal sonographic surveillance findings: a). Normal fluid volume b). Normal antepartum fetal assessment with BPP 8/8 c). Normal fetal Doppler ratios with consistent diastolic flow 3.  Normal general sonographic findings Recommend continued prenatal evaluations and care based on this sonogram and as clinically indicated from the patient's clinical course. Lazaro ArmsURE,LUTHER H  03/26/2017 9:25 AM   [place] Fetal Bpp W/nonstress  Result Date: 03/26/2017 FOLLOW UP SONOGRAM Ophelia CharterMary Harrington is in the office for a follow up sonogram for BPP and cord doppler. She is a 36 y.o. year old G2P1001 with Estimated Date of Delivery: 04/10/17 by date of conception now at  6628w6d weeks gestation. Thus far the pregnancy has been complicated by borderline HTN,AMA. GESTATION: SINGLETON PRESENTATION: cephalic FETAL ACTIVITY:          Heart rate         140          The fetus is active. AMNIOTIC FLUID: The amniotic fluid volume is  normal, 12 cm. PLACENTA LOCALIZATION:  posterior GRADE 3 ADNEXA: The ovaries are normal. Previous Scans:2 BIOPHYSCIAL PROFILE:                                                                                                      COMMENTS GROSS BODY MOVEMENT                 2  TONE                2  RESPIRATIONS                2  AMNIOTIC FLUID                2                                                          SCORE:  8/8 (Note: NST was not performed as part of this antepartum testing) DOPPLER FLOW STUDIES: UMBILICAL ARTERY RI RATIOS:   0.45, 0.40  4.9% ANATOMICAL SURVEY  COMMENTS CEREBRAL VENTRICLES yes normal  CHOROID PLEXUS yes normal                          FACIAL PROFILE yes normal  4 CHAMBERED HEART yes normal  OUTFLOW TRACTS yes normal  DIAPHRAGM yes normal  STOMACH yes normal  RENAL REGION yes normal  BLADDER yes normal      3 VESSEL CORD yes normal              GENITALIA yes normal female     SUSPECTED ABNORMALITIES:  no QUALITY OF SCAN: satisfactory TECHNICIAN COMMENTS: [place] 37+6 wks,cephalic,fhr 140 bpm,BPP 8/8,RI .45,.40 4.9%,AFI 12 cm,normal ovaries bilat,post pl gr 3 A copy of this report including all images has been saved and backed up to a second source for retrieval if needed. All measures and details of the anatomical scan, placentation, fluid volume and pelvic anatomy are contained in that  report. Amber Flora Lipps 03/26/2017 9:12 AM Clinical Impression and recommendations: I have reviewed the sonogram results above, combined with the patient's current clinical course, below are my impressions and any appropriate recommendations for management based on the sonographic findings. 1.  G2P1001 Estimated Date of Delivery: 04/10/17 by serial sonographic evaluations 2.  Fetal sonographic surveillance findings: a). Normal fluid volume b). Normal antepartum fetal assessment with BPP 8/8 c). Normal fetal Doppler ratios with consistent diastolic flow 3.  Normal general sonographic findings Recommend continued prenatal evaluations and care based on this sonogram and as clinically indicated from the patient's clinical course. Lazaro Arms 03/26/2017 9:25 AM    Procedures Procedures (including critical care time)  Medications Ordered in ED Medications  clindamycin (CLEOCIN) capsule 600 mg (not administered)     Initial Impression / Assessment and Plan / ED Course  I have reviewed the triage vital signs and the nursing notes.  Pertinent labs & imaging results that were available during my care of the patient were reviewed by me and considered in my medical decision making (see chart for details).    Doppler study of right lower extremity shows no obvious DVT. However she does have a 1.7 cm fluid collection in the right medial thigh and edematous soft tissues suggesting cellulitis. This was discussed with her obstetrician Dr. Despina Hidden.  He recommended clindamycin 600 mg 3 times a day for 1 day, then 300 mg 3 times a day. This was discussed with the patient.   Final Clinical Impressions(s) / ED Diagnoses   Final diagnoses:  Cellulitis of right lower extremity    New Prescriptions New Prescriptions   CLINDAMYCIN (CLEOCIN) 300 MG CAPSULE    Take 1 capsule (300 mg total) by mouth 3 (three) times daily.  I personally performed the services described in this documentation, which was scribed in my  presence. The recorded information has been reviewed and is accurate.      Donnetta Hutching, MD 03/27/17 2120

## 2017-03-27 NOTE — Progress Notes (Signed)
EFM reapplied by ED staff at this time; NST in progress

## 2017-03-27 NOTE — Progress Notes (Signed)
Dr Erin FullingHarraway-Smith updated on patient complaints at this time; NST performed and reactive; no furthers orders given at this time; OB cleared at this time

## 2017-03-27 NOTE — ED Triage Notes (Signed)
Patient complaining of right leg swelling and pain x 4 days. States she was sent over by Dr Despina HiddenEure for ultrasound of right leg. Denies chest pain or shortness of breath.

## 2017-03-27 NOTE — ED Notes (Signed)
Pt [facility]

## 2017-03-27 NOTE — ED Notes (Signed)
Pt returned from ultrasound pt had fetal monitor reapplied pt has no c/o at this time. Womens is monitoring the patient's fetal monitor. No distress noted.

## 2017-03-31 ENCOUNTER — Encounter: Payer: Self-pay | Admitting: Obstetrics & Gynecology

## 2017-03-31 ENCOUNTER — Ambulatory Visit (INDEPENDENT_AMBULATORY_CARE_PROVIDER_SITE_OTHER): Payer: BLUE CROSS/BLUE SHIELD | Admitting: Obstetrics & Gynecology

## 2017-03-31 VITALS — BP 138/86 | HR 122 | Wt 289.0 lb

## 2017-03-31 DIAGNOSIS — Z1389 Encounter for screening for other disorder: Secondary | ICD-10-CM

## 2017-03-31 DIAGNOSIS — L03115 Cellulitis of right lower limb: Secondary | ICD-10-CM

## 2017-03-31 DIAGNOSIS — Z331 Pregnant state, incidental: Secondary | ICD-10-CM

## 2017-03-31 DIAGNOSIS — O0993 Supervision of high risk pregnancy, unspecified, third trimester: Secondary | ICD-10-CM

## 2017-03-31 DIAGNOSIS — O133 Gestational [pregnancy-induced] hypertension without significant proteinuria, third trimester: Secondary | ICD-10-CM

## 2017-03-31 DIAGNOSIS — R030 Elevated blood-pressure reading, without diagnosis of hypertension: Secondary | ICD-10-CM

## 2017-03-31 LAB — POCT URINALYSIS DIPSTICK
Blood, UA: NEGATIVE
Glucose, UA: NEGATIVE
KETONES UA: NEGATIVE
LEUKOCYTES UA: NEGATIVE
Nitrite, UA: NEGATIVE

## 2017-03-31 MED ORDER — CLINDAMYCIN HCL 300 MG PO CAPS: ORAL_CAPSULE | ORAL | 0 refills | Status: DC

## 2017-03-31 NOTE — Progress Notes (Signed)
Fetal Surveillance Testing today:  Reactive NST   High Risk Pregnancy Diagnosis(es):   Borderline GHTN, right leg cellulitis due to lymphatic compression  G2P1001 6746w4d Estimated Date of Delivery: 04/10/17  Blood pressure 138/86, pulse (!) 122, weight 289 lb (131.1 kg), last menstrual period 04/19/2016.  Urinalysis: Negative   HPI: The patient is being seen today for ongoing management of as above. Today she reports some improvement in right leg first day now    BP weight and urine results all reviewed and noted. Patient reports good fetal movement, denies any bleeding and no rupture of membranes symptoms or regular contractions.  Fundal Height:  40 Fetal Heart rate:  135 Edema:  3+  Patient is without complaints other than noted in her HPI. All questions were answered.  All lab and sonogram results have been reviewed. Comments:    Assessment:  1.  Pregnancy at 5346w4d,  Estimated Date of Delivery: 04/10/17 :                          2.  Cellulitis right leg from lymphatic compression/stasis                        3.  Borderline GHTN  Medication(s) Plans:  cleocine  Treatment Plan:  Delivery by C section 04/03/2017  Return in about 10 days (around 04/10/2017) for Post Op, with Dr Despina HiddenEure. for appointment for high risk OB care  Meds ordered this encounter  Medications  . docusate sodium (COLACE) 100 MG capsule    Sig: Take 100 mg by mouth 2 (two) times daily.  . clindamycin (CLEOCIN) 300 MG capsule    Sig: Take 2 tablets TID    Dispense:  60 capsule    Refill:  0   Orders Placed This Encounter  Procedures  . POCT urinalysis dipstick

## 2017-04-01 NOTE — Patient Instructions (Signed)
20 Ophelia CharterMary Van  04/01/2017   Your procedure is scheduled on:  04/03/2017  [facility].  Pick up the phone at the desk and dial [phone].   Call this number if you have problems the morning of surgery: [phone]   Remember:   Do not eat food:After Midnight.  Do not drink clear liquids: After Midnight.  Take these medicines the morning of surgery with A SIP OF WATER: none   Do not wear jewelry, make-up or nail polish.  Do not wear lotions, powders, or perfumes. Do not wear deodorant.  Do not shave 48 hours prior to surgery.  Do not bring valuables to the hospital.  [facility] is not   responsible for any belongings or valuables brought to the hospital.  Contacts, dentures or bridgework may not be worn into surgery.  Leave suitcase in the car. After surgery it may be brought to your room.  For patients admitted to the hospital, checkout time is 11:00 AM the day of              discharge.   Patients discharged the day of surgery will not be allowed to drive             home.  Name and phone number of your driver: na  Special Instructions:   N/A   Please read over the following fact sheets that you were given:   Surgical Site Infection Prevention

## 2017-04-02 ENCOUNTER — Other Ambulatory Visit: Payer: Self-pay | Admitting: Obstetrics and Gynecology

## 2017-04-02 ENCOUNTER — Encounter (HOSPITAL_COMMUNITY)
Admission: RE | Admit: 2017-04-02 | Discharge: 2017-04-02 | Disposition: A | Discharge status: Home / Self Care | Payer: BLUE CROSS/BLUE SHIELD | Source: Ambulatory Visit | Attending: Obstetrics & Gynecology | Admitting: Obstetrics & Gynecology

## 2017-04-02 ENCOUNTER — Other Ambulatory Visit: Payer: Self-pay | Admitting: Obstetrics & Gynecology

## 2017-04-02 DIAGNOSIS — Z3A39 39 weeks gestation of pregnancy: Secondary | ICD-10-CM | POA: Diagnosis not present

## 2017-04-02 DIAGNOSIS — O1204 Gestational edema, complicating childbirth: Secondary | ICD-10-CM | POA: Diagnosis not present

## 2017-04-02 DIAGNOSIS — Z8249 Family history of ischemic heart disease and other diseases of the circulatory system: Secondary | ICD-10-CM | POA: Diagnosis not present

## 2017-04-02 DIAGNOSIS — O34219 Maternal care for unspecified type scar from previous cesarean delivery: Secondary | ICD-10-CM | POA: Diagnosis not present

## 2017-04-02 DIAGNOSIS — Z6841 Body Mass Index (BMI) 40.0 and over, adult: Secondary | ICD-10-CM | POA: Diagnosis not present

## 2017-04-02 DIAGNOSIS — L03115 Cellulitis of right lower limb: Secondary | ICD-10-CM | POA: Diagnosis not present

## 2017-04-02 DIAGNOSIS — O34211 Maternal care for low transverse scar from previous cesarean delivery: Secondary | ICD-10-CM | POA: Diagnosis not present

## 2017-04-02 DIAGNOSIS — O9972 Diseases of the skin and subcutaneous tissue complicating childbirth: Secondary | ICD-10-CM | POA: Diagnosis not present

## 2017-04-02 DIAGNOSIS — Z880 Allergy status to penicillin: Secondary | ICD-10-CM | POA: Diagnosis not present

## 2017-04-02 DIAGNOSIS — Z23 Encounter for immunization: Secondary | ICD-10-CM | POA: Diagnosis not present

## 2017-04-02 DIAGNOSIS — Q825 Congenital non-neoplastic nevus: Secondary | ICD-10-CM | POA: Diagnosis not present

## 2017-04-02 DIAGNOSIS — O99214 Obesity complicating childbirth: Secondary | ICD-10-CM | POA: Diagnosis not present

## 2017-04-02 LAB — CBC
HEMATOCRIT: 37.3 % (ref 36.0–46.0)
HEMOGLOBIN: 12.5 g/dL (ref 12.0–15.0)
MCH: 29.3 pg (ref 26.0–34.0)
MCHC: 33.5 g/dL (ref 30.0–36.0)
MCV: 87.6 fL (ref 78.0–100.0)
Platelets: 152 10*3/uL (ref 150–400)
RBC: 4.26 MIL/uL (ref 3.87–5.11)
RDW: 15.7 % — AB (ref 11.5–15.5)
WBC: 8.1 10*3/uL (ref 4.0–10.5)

## 2017-04-02 LAB — TYPE AND SCREEN
ABO/RH(D): O POS
ANTIBODY SCREEN: NEGATIVE

## 2017-04-02 LAB — ABO/RH: ABO/RH(D): O POS

## 2017-04-03 ENCOUNTER — Inpatient Hospital Stay (HOSPITAL_COMMUNITY): Payer: BLUE CROSS/BLUE SHIELD | Admitting: Anesthesiology

## 2017-04-03 ENCOUNTER — Inpatient Hospital Stay (HOSPITAL_COMMUNITY)
Admission: RE | Admit: 2017-04-03 | Discharge: 2017-04-06 | DRG: 765 | Disposition: A | Discharge status: Home / Self Care | Payer: BLUE CROSS/BLUE SHIELD | Source: Ambulatory Visit | Attending: Obstetrics & Gynecology | Admitting: Obstetrics & Gynecology

## 2017-04-03 ENCOUNTER — Encounter (HOSPITAL_COMMUNITY)
Admission: RE | Disposition: A | Discharge status: Home / Self Care | Payer: Self-pay | Source: Ambulatory Visit | Attending: Obstetrics & Gynecology

## 2017-04-03 ENCOUNTER — Encounter (HOSPITAL_COMMUNITY): Payer: Self-pay | Admitting: Anesthesiology

## 2017-04-03 DIAGNOSIS — O9972 Diseases of the skin and subcutaneous tissue complicating childbirth: Secondary | ICD-10-CM | POA: Diagnosis present

## 2017-04-03 DIAGNOSIS — Z6841 Body Mass Index (BMI) 40.0 and over, adult: Secondary | ICD-10-CM | POA: Diagnosis not present

## 2017-04-03 DIAGNOSIS — O99214 Obesity complicating childbirth: Secondary | ICD-10-CM | POA: Diagnosis present

## 2017-04-03 DIAGNOSIS — Z880 Allergy status to penicillin: Secondary | ICD-10-CM

## 2017-04-03 DIAGNOSIS — Z3403 Encounter for supervision of normal first pregnancy, third trimester: Secondary | ICD-10-CM

## 2017-04-03 DIAGNOSIS — Z3A39 39 weeks gestation of pregnancy: Secondary | ICD-10-CM | POA: Diagnosis not present

## 2017-04-03 DIAGNOSIS — O1204 Gestational edema, complicating childbirth: Secondary | ICD-10-CM | POA: Diagnosis present

## 2017-04-03 DIAGNOSIS — R030 Elevated blood-pressure reading, without diagnosis of hypertension: Secondary | ICD-10-CM

## 2017-04-03 DIAGNOSIS — L03115 Cellulitis of right lower limb: Secondary | ICD-10-CM | POA: Diagnosis present

## 2017-04-03 DIAGNOSIS — Q825 Congenital non-neoplastic nevus: Secondary | ICD-10-CM | POA: Diagnosis not present

## 2017-04-03 DIAGNOSIS — O34219 Maternal care for unspecified type scar from previous cesarean delivery: Secondary | ICD-10-CM | POA: Diagnosis not present

## 2017-04-03 DIAGNOSIS — O34211 Maternal care for low transverse scar from previous cesarean delivery: Principal | ICD-10-CM | POA: Diagnosis present

## 2017-04-03 DIAGNOSIS — Z98891 History of uterine scar from previous surgery: Secondary | ICD-10-CM

## 2017-04-03 DIAGNOSIS — Z8249 Family history of ischemic heart disease and other diseases of the circulatory system: Secondary | ICD-10-CM | POA: Diagnosis not present

## 2017-04-03 LAB — RPR: RPR Ser Ql: NONREACTIVE

## 2017-04-03 LAB — CREATININE, SERUM
CREATININE: 0.93 mg/dL (ref 0.44–1.00)
GFR calc non Af Amer: >60 mL/min (ref >60)

## 2017-04-03 SURGERY — Surgical Case
Anesthesia: Spinal | Wound class: Clean Contaminated

## 2017-04-03 MED ORDER — BUPIVACAINE IN DEXTROSE 0.75-8.25 % IT SOLN
INTRATHECAL | Status: AC
  Filled 2017-04-03: qty 2

## 2017-04-03 MED ORDER — WITCH HAZEL-GLYCERIN EX PADS: 1.0000 "application " | MEDICATED_PAD | CUTANEOUS | Status: DC | PRN

## 2017-04-03 MED ORDER — GENTAMICIN SULFATE 40 MG/ML IJ SOLN
INTRAVENOUS | Status: AC
  Administered 2017-04-03: 117.5 mL via INTRAVENOUS
  Filled 2017-04-03: qty 11.5

## 2017-04-03 MED ORDER — OXYCODONE-ACETAMINOPHEN 5-325 MG PO TABS: 2.0000 | ORAL_TABLET | ORAL | Status: DC | PRN

## 2017-04-03 MED ORDER — VANCOMYCIN HCL 10 G IV SOLR
1250.0000 mg | Freq: Three times a day (TID) | INTRAVENOUS | Status: DC
  Administered 2017-04-03 – 2017-04-04 (×2): 1250 mg via INTRAVENOUS
  Filled 2017-04-03 (×2): qty 1250
  Filled 2017-04-03 (×2): qty 500

## 2017-04-03 MED ORDER — PHENYLEPHRINE 8 MG IN D5W 100 ML (0.08MG/ML) PREMIX OPTIME
INJECTION | INTRAVENOUS | Status: DC | PRN
Start: 2017-04-03 — End: 2017-04-03
  Administered 2017-04-03: 60 ug/min via INTRAVENOUS

## 2017-04-03 MED ORDER — SENNOSIDES-DOCUSATE SODIUM 8.6-50 MG PO TABS
2.0000 | ORAL_TABLET | ORAL | Status: DC
  Administered 2017-04-04 – 2017-04-05 (×3): 2 via ORAL
  Filled 2017-04-03 (×3): qty 2

## 2017-04-03 MED ORDER — LACTATED RINGERS IV SOLN
INTRAVENOUS | Status: DC
  Administered 2017-04-04: 03:00:00 via INTRAVENOUS

## 2017-04-03 MED ORDER — SIMETHICONE 80 MG PO CHEW: 80.0000 mg | CHEWABLE_TABLET | ORAL | Status: DC | PRN

## 2017-04-03 MED ORDER — SIMETHICONE 80 MG PO CHEW
80.0000 mg | CHEWABLE_TABLET | ORAL | Status: DC
  Administered 2017-04-04 – 2017-04-05 (×3): 80 mg via ORAL
  Filled 2017-04-03 (×3): qty 1

## 2017-04-03 MED ORDER — NALBUPHINE HCL 10 MG/ML IJ SOLN: 5.0000 mg | Freq: Once | INTRAMUSCULAR | Status: DC | PRN

## 2017-04-03 MED ORDER — DIBUCAINE 1 % RE OINT: 1.0000 "application " | TOPICAL_OINTMENT | RECTAL | Status: DC | PRN

## 2017-04-03 MED ORDER — SIMETHICONE 80 MG PO CHEW
80.0000 mg | CHEWABLE_TABLET | Freq: Three times a day (TID) | ORAL | Status: DC
  Administered 2017-04-03 – 2017-04-06 (×7): 80 mg via ORAL
  Filled 2017-04-03 (×7): qty 1

## 2017-04-03 MED ORDER — ZOLPIDEM TARTRATE 5 MG PO TABS: 5.0000 mg | ORAL_TABLET | Freq: Every evening | ORAL | Status: DC | PRN

## 2017-04-03 MED ORDER — NALBUPHINE HCL 10 MG/ML IJ SOLN: 5.0000 mg | INTRAMUSCULAR | Status: DC | PRN

## 2017-04-03 MED ORDER — SCOPOLAMINE 1 MG/3DAYS TD PT72
1.0000 | MEDICATED_PATCH | Freq: Once | TRANSDERMAL | Status: DC
  Administered 2017-04-03: 1.5 mg via TRANSDERMAL
  Filled 2017-04-03: qty 1

## 2017-04-03 MED ORDER — FUROSEMIDE 10 MG/ML IJ SOLN
40.0000 mg | Freq: Four times a day (QID) | INTRAMUSCULAR | Status: DC
  Administered 2017-04-03 (×2): 40 mg via INTRAVENOUS
  Filled 2017-04-03 (×2): qty 4

## 2017-04-03 MED ORDER — IBUPROFEN 600 MG PO TABS: 600.0000 mg | ORAL_TABLET | Freq: Four times a day (QID) | ORAL | Status: DC | PRN

## 2017-04-03 MED ORDER — PRENATAL MULTIVITAMIN CH
1.0000 | ORAL_TABLET | Freq: Every day | ORAL | Status: DC
  Administered 2017-04-04 – 2017-04-06 (×3): 1 via ORAL
  Filled 2017-04-03 (×3): qty 1

## 2017-04-03 MED ORDER — DIPHENHYDRAMINE HCL 25 MG PO CAPS: 25.0000 mg | ORAL_CAPSULE | ORAL | Status: DC | PRN

## 2017-04-03 MED ORDER — MORPHINE SULFATE (PF) 0.5 MG/ML IJ SOLN
INTRAMUSCULAR | Status: AC
  Filled 2017-04-03: qty 10

## 2017-04-03 MED ORDER — IBUPROFEN 600 MG PO TABS
600.0000 mg | ORAL_TABLET | Freq: Four times a day (QID) | ORAL | Status: DC
  Administered 2017-04-03 – 2017-04-06 (×12): 600 mg via ORAL
  Filled 2017-04-03 (×12): qty 1

## 2017-04-03 MED ORDER — SOD CITRATE-CITRIC ACID 500-334 MG/5ML PO SOLN
30.0000 mL | Freq: Once | ORAL | Status: AC
  Administered 2017-04-03: 30 mL via ORAL
  Filled 2017-04-03: qty 15

## 2017-04-03 MED ORDER — PHENYLEPHRINE 40 MCG/ML (10ML) SYRINGE FOR IV PUSH (FOR BLOOD PRESSURE SUPPORT)
PREFILLED_SYRINGE | INTRAVENOUS | Status: DC | PRN
  Administered 2017-04-03: 80 ug via INTRAVENOUS
  Administered 2017-04-03: 120 ug via INTRAVENOUS

## 2017-04-03 MED ORDER — MEPERIDINE HCL 25 MG/ML IJ SOLN: 6.2500 mg | INTRAMUSCULAR | Status: DC | PRN

## 2017-04-03 MED ORDER — FUROSEMIDE 10 MG/ML IJ SOLN
INTRAMUSCULAR | Status: AC
  Filled 2017-04-03: qty 4

## 2017-04-03 MED ORDER — DIPHENHYDRAMINE HCL 25 MG PO CAPS: 25.0000 mg | ORAL_CAPSULE | Freq: Four times a day (QID) | ORAL | Status: DC | PRN

## 2017-04-03 MED ORDER — METHYLERGONOVINE MALEATE 0.2 MG/ML IJ SOLN: 0.2000 mg | INTRAMUSCULAR | Status: DC | PRN

## 2017-04-03 MED ORDER — OXYTOCIN 10 UNIT/ML IJ SOLN
INTRAMUSCULAR | Status: DC | PRN
  Administered 2017-04-03: 40 [IU] via INTRAVENOUS

## 2017-04-03 MED ORDER — ONDANSETRON HCL 4 MG/2ML IJ SOLN: 4.0000 mg | Freq: Three times a day (TID) | INTRAMUSCULAR | Status: DC | PRN

## 2017-04-03 MED ORDER — KETOROLAC TROMETHAMINE 30 MG/ML IJ SOLN
30.0000 mg | Freq: Four times a day (QID) | INTRAMUSCULAR | Status: AC | PRN
  Administered 2017-04-03: 30 mg via INTRAMUSCULAR

## 2017-04-03 MED ORDER — LIDOCAINE HCL (PF) 1 % IJ SOLN
INTRAMUSCULAR | Status: AC
  Filled 2017-04-03: qty 5

## 2017-04-03 MED ORDER — METHYLERGONOVINE MALEATE 0.2 MG PO TABS: 0.2000 mg | ORAL_TABLET | ORAL | Status: DC | PRN

## 2017-04-03 MED ORDER — MENTHOL 3 MG MT LOZG: 1.0000 | LOZENGE | OROMUCOSAL | Status: DC | PRN

## 2017-04-03 MED ORDER — FENTANYL CITRATE (PF) 100 MCG/2ML IJ SOLN: 25.0000 ug | INTRAMUSCULAR | Status: DC | PRN

## 2017-04-03 MED ORDER — FENTANYL CITRATE (PF) 100 MCG/2ML IJ SOLN
INTRAMUSCULAR | Status: DC | PRN
  Administered 2017-04-03: 20 ug via INTRATHECAL

## 2017-04-03 MED ORDER — VANCOMYCIN HCL 10 G IV SOLR
2500.0000 mg | Freq: Once | INTRAVENOUS | Status: AC
  Administered 2017-04-03: 2500 mg via INTRAVENOUS
  Filled 2017-04-03: qty 500

## 2017-04-03 MED ORDER — NALOXONE HCL 2 MG/2ML IJ SOSY
1.0000 ug/kg/h | PREFILLED_SYRINGE | INTRAVENOUS | Status: DC | PRN
  Filled 2017-04-03: qty 2

## 2017-04-03 MED ORDER — KETOROLAC TROMETHAMINE 30 MG/ML IJ SOLN
INTRAMUSCULAR | Status: AC
  Filled 2017-04-03: qty 1

## 2017-04-03 MED ORDER — NALOXONE HCL 0.4 MG/ML IJ SOLN: 0.4000 mg | INTRAMUSCULAR | Status: DC | PRN

## 2017-04-03 MED ORDER — KETOROLAC TROMETHAMINE 30 MG/ML IJ SOLN: 30.0000 mg | Freq: Four times a day (QID) | INTRAMUSCULAR | Status: AC | PRN

## 2017-04-03 MED ORDER — BUPIVACAINE IN DEXTROSE 0.75-8.25 % IT SOLN
INTRATHECAL | Status: DC | PRN
  Administered 2017-04-03: 1.8 mL via INTRATHECAL

## 2017-04-03 MED ORDER — PRENATAL MULTIVITAMIN CH: 1.0000 | ORAL_TABLET | Freq: Every day | ORAL | Status: DC

## 2017-04-03 MED ORDER — ONDANSETRON HCL 4 MG/2ML IJ SOLN
INTRAMUSCULAR | Status: AC
  Filled 2017-04-03: qty 2

## 2017-04-03 MED ORDER — ACETAMINOPHEN 325 MG PO TABS: 650.0000 mg | ORAL_TABLET | ORAL | Status: DC | PRN

## 2017-04-03 MED ORDER — OXYCODONE-ACETAMINOPHEN 5-325 MG PO TABS
1.0000 | ORAL_TABLET | ORAL | Status: DC | PRN
  Administered 2017-04-06: 1 via ORAL
  Filled 2017-04-03: qty 1

## 2017-04-03 MED ORDER — FENTANYL CITRATE (PF) 100 MCG/2ML IJ SOLN
INTRAMUSCULAR | Status: AC
  Filled 2017-04-03: qty 2

## 2017-04-03 MED ORDER — SODIUM CHLORIDE 0.9% FLUSH: 3.0000 mL | INTRAVENOUS | Status: DC | PRN

## 2017-04-03 MED ORDER — OXYTOCIN 40 UNITS IN LACTATED RINGERS INFUSION - SIMPLE MED: 2.5000 [IU]/h | INTRAVENOUS | Status: AC

## 2017-04-03 MED ORDER — DIPHENHYDRAMINE HCL 50 MG/ML IJ SOLN: 12.5000 mg | INTRAMUSCULAR | Status: DC | PRN

## 2017-04-03 MED ORDER — TETANUS-DIPHTH-ACELL PERTUSSIS 5-2.5-18.5 LF-MCG/0.5 IM SUSP
0.5000 mL | Freq: Once | INTRAMUSCULAR | Status: AC
  Administered 2017-04-05: 0.5 mL via INTRAMUSCULAR

## 2017-04-03 MED ORDER — PHENYLEPHRINE 8 MG IN D5W 100 ML (0.08MG/ML) PREMIX OPTIME
INJECTION | INTRAVENOUS | Status: AC
  Filled 2017-04-03: qty 100

## 2017-04-03 MED ORDER — COCONUT OIL OIL
1.0000 "application " | TOPICAL_OIL | Status: DC | PRN
  Filled 2017-04-03: qty 120

## 2017-04-03 MED ORDER — ACETAMINOPHEN 500 MG PO TABS
1000.0000 mg | ORAL_TABLET | Freq: Four times a day (QID) | ORAL | Status: AC
  Administered 2017-04-03 – 2017-04-04 (×3): 1000 mg via ORAL
  Filled 2017-04-03 (×3): qty 2

## 2017-04-03 MED ORDER — METOCLOPRAMIDE HCL 5 MG/ML IJ SOLN: 10.0000 mg | Freq: Once | INTRAMUSCULAR | Status: DC | PRN

## 2017-04-03 MED ORDER — ONDANSETRON HCL 4 MG/2ML IJ SOLN
INTRAMUSCULAR | Status: DC | PRN
  Administered 2017-04-03: 4 mg via INTRAVENOUS

## 2017-04-03 MED ORDER — MORPHINE SULFATE (PF) 0.5 MG/ML IJ SOLN
INTRAMUSCULAR | Status: DC | PRN
  Administered 2017-04-03: 200 ug via INTRATHECAL

## 2017-04-03 MED ORDER — OXYTOCIN 10 UNIT/ML IJ SOLN
INTRAMUSCULAR | Status: AC
  Filled 2017-04-03: qty 4

## 2017-04-03 MED ORDER — SODIUM CHLORIDE 0.9 % IR SOLN
Status: DC | PRN
  Administered 2017-04-03: 1

## 2017-04-03 MED ORDER — LACTATED RINGERS IV SOLN
INTRAVENOUS | Status: DC
  Administered 2017-04-03 (×4): via INTRAVENOUS

## 2017-04-03 SURGICAL SUPPLY — 41 items
CHLORAPREP W/TINT 26ML (MISCELLANEOUS) ×4 IMPLANT
CLAMP CORD UMBIL (MISCELLANEOUS) IMPLANT
CLOTH BEACON ORANGE TIMEOUT ST (SAFETY) ×2 IMPLANT
DERMABOND ADHESIVE PROPEN (GAUZE/BANDAGES/DRESSINGS) ×1
DERMABOND ADVANCED (GAUZE/BANDAGES/DRESSINGS) ×2
DERMABOND ADVANCED .7 DNX12 (GAUZE/BANDAGES/DRESSINGS) ×2 IMPLANT
DERMABOND ADVANCED .7 DNX6 (GAUZE/BANDAGES/DRESSINGS) ×1 IMPLANT
DRSG OPSITE POSTOP 4X10 (GAUZE/BANDAGES/DRESSINGS) ×2 IMPLANT
ELECT REM PT RETURN 9FT ADLT (ELECTROSURGICAL) ×2
ELECTRODE REM PT RTRN 9FT ADLT (ELECTROSURGICAL) ×1 IMPLANT
EXTRACTOR VACUUM BELL STYLE (SUCTIONS) IMPLANT
GAUZE SPONGE 4X4 12PLY STRL LF (GAUZE/BANDAGES/DRESSINGS) ×4 IMPLANT
GLOVE BIOGEL PI IND STRL 7.0 (GLOVE) ×1 IMPLANT
GLOVE BIOGEL PI IND STRL 8 (GLOVE) ×1 IMPLANT
GLOVE BIOGEL PI INDICATOR 7.0 (GLOVE) ×1
GLOVE BIOGEL PI INDICATOR 8 (GLOVE) ×1
GLOVE ECLIPSE 8.0 STRL XLNG CF (GLOVE) ×2 IMPLANT
GOWN STRL REUS W/TWL LRG LVL3 (GOWN DISPOSABLE) ×4 IMPLANT
HOVERMATT SINGLE USE (MISCELLANEOUS) ×2 IMPLANT
KIT ABG SYR 3ML LUER SLIP (SYRINGE) ×2 IMPLANT
NEEDLE HYPO 18GX1.5 BLUNT FILL (NEEDLE) ×2 IMPLANT
NEEDLE HYPO 22GX1.5 SAFETY (NEEDLE) ×2 IMPLANT
NEEDLE HYPO 25X5/8 SAFETYGLIDE (NEEDLE) ×2 IMPLANT
NS IRRIG 1000ML POUR BTL (IV SOLUTION) ×4 IMPLANT
PACK C SECTION WH (CUSTOM PROCEDURE TRAY) ×2 IMPLANT
PAD ABD 7.5X8 STRL (GAUZE/BANDAGES/DRESSINGS) ×4 IMPLANT
PAD OB MATERNITY 4.3X12.25 (PERSONAL CARE ITEMS) ×2 IMPLANT
PENCIL SMOKE EVAC W/HOLSTER (ELECTROSURGICAL) ×2 IMPLANT
RTRCTR C-SECT PINK 25CM LRG (MISCELLANEOUS) IMPLANT
SUT CHROMIC 0 CT 1 (SUTURE) ×2 IMPLANT
SUT MNCRL 0 VIOLET CTX 36 (SUTURE) ×2 IMPLANT
SUT MONOCRYL 0 CTX 36 (SUTURE) ×2
SUT PLAIN 2 0 (SUTURE)
SUT PLAIN 2 0 XLH (SUTURE) IMPLANT
SUT PLAIN ABS 2-0 CT1 27XMFL (SUTURE) IMPLANT
SUT VIC AB 0 CTX 36 (SUTURE) ×1
SUT VIC AB 0 CTX36XBRD ANBCTRL (SUTURE) ×1 IMPLANT
SUT VIC AB 4-0 KS 27 (SUTURE) IMPLANT
SYR 20CC LL (SYRINGE) ×4 IMPLANT
TOWEL OR 17X24 6PK STRL BLUE (TOWEL DISPOSABLE) ×2 IMPLANT
TRAY FOLEY BAG SILVER LF 14FR (SET/KITS/TRAYS/PACK) IMPLANT

## 2017-04-03 NOTE — Addendum Note (Signed)
Addendum  created 04/03/17 1357 by Renford DillsMullins, Harleen Fineberg L, CRNA   Sign clinical note

## 2017-04-03 NOTE — Plan of Care (Signed)
Problem: Safety: Goal: Ability to remain free from injury will improve Encouraged patient to call for assistance getting out of bed until RN staff encouraged independent ambulation.  Problem: Education: Goal: Knowledge of condition will improve Admission education, safety and unit protocols reviewed with patient and significant other.   Problem: Nutritional: Goal: Mothers verbalization of comfort with breastfeeding process will improve Assisted mother with latching baby. Discussed proper positioning and signs of correct latching. Baby feeding well and mother comfortable and receptive.

## 2017-04-03 NOTE — Anesthesia Procedure Notes (Signed)
Spinal  Patient location during procedure: OR Start time: 04/03/2017 8:32 AM Staffing Anesthesiologist: Mal AmabileFOSTER, Nancylee Gaines Performed: anesthesiologist  Preanesthetic Checklist Completed: patient identified, site marked, surgical consent, pre-op evaluation, timeout performed, IV checked, risks and benefits discussed and monitors and equipment checked Spinal Block Patient position: sitting Prep: site prepped and draped and DuraPrep Patient monitoring: heart rate, cardiac monitor, continuous pulse ox and blood pressure Approach: midline Location: L3-4 Injection technique: single-shot Needle Needle type: Sprotte  Needle gauge: 24 G Needle length: 9 cm Needle insertion depth: 6 cm Assessment Sensory level: T4 Additional Notes Patient tolerated procedure well. Adequate sensory level.

## 2017-04-03 NOTE — Transfer of Care (Signed)
Immediate Anesthesia Transfer of Care Note  Patient: Sonya Chapman  Procedure(s) Performed: Procedure(s): REPEAT CESAREAN SECTION (N/A)  Patient Location: PACU  Anesthesia Type:Spinal  Level of Consciousness: awake  Airway & Oxygen Therapy: Patient Spontanous Breathing  Post-op Assessment: Report given to RN  Post vital signs: Reviewed and stable  Last Vitals:  Vitals:   04/03/17 0723  BP: 133/88  Pulse: (!) 107  Resp: 18  Temp: 36.9 C    Last Pain:  Vitals:   04/03/17 0723  TempSrc: Oral         Complications: No apparent anesthesia complications

## 2017-04-03 NOTE — Progress Notes (Signed)
Dr Erasmo DownerMamoo called to update on pt's orthostatic blood pressures.   Provider in a delivery will return call when available

## 2017-04-03 NOTE — Anesthesia Preprocedure Evaluation (Addendum)
Anesthesia Evaluation  Patient identified by MRN, date of birth, ID band Patient awake    Reviewed: Allergy & Precautions, NPO status , Patient's Chart, lab work & pertinent test results  Airway Mallampati: II  TM Distance: >3 FB Neck ROM: Full    Dental no notable dental hx. (+) Teeth Intact   Pulmonary neg pulmonary ROS,    Pulmonary exam normal breath sounds clear to auscultation- rhonchi       Cardiovascular negative cardio ROS Normal cardiovascular exam Rhythm:Regular Rate:Normal     Neuro/Psych negative neurological ROS  negative psych ROS   GI/Hepatic Neg liver ROS, GERD  Medicated and Controlled,  Endo/Other  Morbid obesity  Renal/GU negative Renal ROS  negative genitourinary   Musculoskeletal Cellulitis   Abdominal (+) + obese,   Peds  Hematology negative hematology ROS (+)   Anesthesia Other Findings   Reproductive/Obstetrics (+) Pregnancy Previous C/Section                            Anesthesia Physical Anesthesia Plan  ASA: III  Anesthesia Plan: Spinal   Post-op Pain Management:    Induction:   Airway Management Planned: Natural Airway and Simple Face Mask  Additional Equipment:   Intra-op Plan:   Post-operative Plan:   Informed Consent: I have reviewed the patients History and Physical, chart, labs and discussed the procedure including the risks, benefits and alternatives for the proposed anesthesia with the patient or authorized representative who has indicated his/her understanding and acceptance.   Dental advisory given  Plan Discussed with: Anesthesiologist, CRNA and Surgeon  Anesthesia Plan Comments:        Anesthesia Quick Evaluation

## 2017-04-03 NOTE — Lactation Note (Addendum)
This note was copied from a baby's chart. Lactation Consultation Note  P2, Baby 5 hours old.  Ex BF for 1 year. Latched and unlatched during consult. Baby opens wide.  Intermittent sucks and swallows observed. Discussed supply and demand.  Mom encouraged to feed baby 8-12 times/24 hours and with feeding cues.  Baby breastfed for approx 10 min and then came off.  Placed baby STS on mother's chest. Discussed basics.  Mom made aware of O/P services, breastfeeding support groups, community resources, and our phone # for post-discharge questions.    Patient Name: Girl Ophelia CharterMary Pio Today's Date: 04/03/2017 Reason for consult: Initial assessment   Maternal Data Has patient been taught Hand Expression?: Yes Does the patient have breastfeeding experience prior to this delivery?: Yes  Feeding Feeding Type: Breast Fed Length of feed: 20 min  LATCH Score/Interventions Latch: Grasps breast easily, tongue down, lips flanged, rhythmical sucking.  Audible Swallowing: A few with stimulation  Type of Nipple: Everted at rest and after stimulation  Comfort (Breast/Nipple): Soft / non-tender     Hold (Positioning): No assistance needed to correctly position infant at breast.  LATCH Score: 9  Lactation Tools Discussed/Used     Consult Status Consult Status: Follow-up Date: 04/04/17 Follow-up type: In-patient    Dahlia ByesBerkelhammer, Jezebel Pollet [facility] 04/03/2017, 2:17 PM

## 2017-04-03 NOTE — Op Note (Signed)
Preoperative diagnosis:  1.  Intrauterine pregnancy at 5580w0d  weeks gestation                                         2.  Previous Caesarean section, declines trial of labor                                         3.  cellulitis right thigh due to lymphatic compression   Postoperative diagnosis:  Same as above  Procedure:  Repeat cesarean section  Surgeon:  Lazaro ArmsLuther H Bronson Bressman MD  Assistant:    Anesthesia: Spinal  Findings:  .    Over a low transverse incision was delivered a viable female with Apgars of 8 and 9 weighing pending lbs.  oz. Uterus, tubes and ovaries were all normal.  There were no other significant findings  Description of operation:  Patient was taken to the operating room and placed in the sitting position where she underwent a spinal anesthetic. She was then placed in the supine position with tilt to the left side. When adequate anesthetic level was obtained she was prepped and draped in usual sterile fashion and a Foley catheter was placed. A Pfannenstiel skin incision was made and carried down sharply to the rectus fascia which was scored in the midline extended laterally. The fascia was taken off the muscles both superiorly and without difficulty. The muscles were divided.  The peritoneal cavity was entered.  Bladder blade was placed, no bladder flap was created.  A low transverse hysterotomy incision was made and delivered a viable female  infant at [id number] with Apgars of 8 and 9 weighingpending lbs  oz.  Arterial Cord pH was obtained and was pending. The uterus was exteriorized. It was closed in 2 layers, the first being a running interlocking layer and the second being an imbricating layer using 0 monocryl on a CTX needle. There was good resulting hemostasis. The uterus tubes and ovaries were all normal. Peritoneal cavity was irrigated vigorously. The muscles and peritoneum were reapproximated loosely. The fascia was closed using 0 Vicryl in running fashion. Subcutaneous tissue was  made hemostatic and irrigated. The skin was closed using 4-0 Vicryl on a Keith needle in a subcuticular fashion.  Dermabond was placed for additional wound integrity and to serve as a barrier. Blood loss for the procedure was 500 cc. The patient received a gentamicin and cleocin prophylactically. The patient was taken to the recovery room in good stable condition with all counts being correct x3.  EBL 500 cc  Of note the patient developed a cellulitis of her right leg(thigh) 1 week ago and has been on po cleocin and elevation.  Post operatively I will treat her with Iv vancomycin, aggressive diuresis and elevation.  She will remain on po cleocin post operatively when she goes home  Salik Grewell H 04/03/2017 9:24 AM

## 2017-04-03 NOTE — Anesthesia Postprocedure Evaluation (Signed)
Anesthesia Post Note  Patient: Sonya Chapman  Procedure(s) Performed: Procedure(s) (LRB): REPEAT CESAREAN SECTION (N/A)     Patient location during evaluation: PACU Anesthesia Type: Spinal Level of consciousness: awake and alert and oriented Pain management: pain level controlled Vital Signs Assessment: post-procedure vital signs reviewed and stable Respiratory status: spontaneous breathing and nonlabored ventilation Cardiovascular status: blood pressure returned to baseline and stable Postop Assessment: no headache, no backache, spinal receding, patient able to bend at knees and no signs of nausea or vomiting Anesthetic complications: no    Last Vitals:  Vitals:   04/03/17 1045 04/03/17 1110  BP: 101/68   Pulse: 85   Resp: 19   Temp: 36.5 C 36.5 C    Last Pain:  Vitals:   04/03/17 1110  TempSrc: Oral  PainSc:    Pain Goal:                 Alishba Naples A.

## 2017-04-03 NOTE — H&P (Signed)
Sonya Chapman is a 36 y.o. female [redacted]w[redacted]d Estimated Date of Delivery: 04/10/17 presenting for repeat Caesarean section. History of previous Caesarean section declines trial of labor.  Both pregnancies have been IVF conceptions.  Pregnancy has been relatively uncomplicated until the last few weeks.  Her BP was creeping up but was never consistent and we treated her as borderline gestational hypertension, labs were normal.  Additionally she began to have more significant lower extremity swelling over the past couple of weeks and it did appear secondarily it was causing lymphatic vessel compression based on the appearance of the skin on exam.  Last Friday, 1 week ago, she contacted me through MyChart and said an area of the medial portion of her right leg was now warm and red.  I ordered a venous Doppler which was negative for a DVT but was consistent with a cellulitis.  I had seen her in the office just a couple of days prior and her exam had indeed changed to this cellulitis clinical picture in that time.  As a result I took her out of work, placed her on oral cleocin and instructed her to leave her leg elevated as much as possible.  I saw her for her NST appointment 3 days ago and it was stable, this am it is worse.  I believe the lymphatic compression is secondary to lower extremity edema and also probably from the pregnancy causing compression of both the deep and superficial lymphatic vessels, then leading to the cellulitis.  Post op I will give her some doses of vancomycin and also aggressively attempt to decrease her lower extremity edema through the use of diuretics.  OB History    Gravida Para Term Preterm AB Living   2 1 1     1    SAB TAB Ectopic Multiple Live Births           1      Obstetric Comments   IVF pregnancy,incision abscess     Past Medical History:  Diagnosis Date  . Cellulitis   . Endometrioma    Past Surgical History:  Procedure Laterality Date  . CESAREAN SECTION    .  EXCISION OF ENDOMETRIOMA  12/26/2015   Procedure: RESECTION OF PERIUMBILICAL ENDOMETRIOMA;  Surgeon: Lazaro Arms, MD;  Location: [facility];  Service: Gynecology;;  . WISDOM TOOTH EXTRACTION     Family History: family history includes Hypertension in her father and mother; Thyroid disease in her mother. Social History:  reports that she has never smoked. She has never used smokeless tobacco. She reports that she does not drink alcohol or use drugs.     Maternal Diabetes: No Genetic Screening: Normal Maternal Ultrasounds/Referrals: Normal Fetal Ultrasounds or other Referrals:  None Maternal Substance Abuse:  No Significant Maternal Medications:  Meds include: Other: cleocin over the past week Significant Maternal Lab Results:  None Other Comments:  development of cellulitis secondary to lymphatic compression directly from the pregnancy itself and lower extremtiy edema  ROS  Per HPI  Review of Systems  Constitutional: Negative for fever, chills, weight loss, malaise/fatigue and diaphoresis.  HENT: Negative for hearing loss, ear pain, nosebleeds, congestion, sore throat, neck pain, tinnitus and ear discharge.   Eyes: Negative for blurred vision, double vision, photophobia, pain, discharge and redness.  Respiratory: Negative for cough, hemoptysis, sputum production, shortness of breath, wheezing and stridor.   Cardiovascular: Negative for chest pain, palpitations, orthopnea, claudication, leg swelling and PND.  Gastrointestinal: positive for abdominal pain. Negative for heartburn, nausea,  vomiting, diarrhea, constipation, blood in stool and melena.  Genitourinary: Negative for dysuria, urgency, frequency, hematuria and flank pain.  Musculoskeletal: Negative for myalgias, back pain, joint pain and falls.  Skin: cellulitis right lower extremity, Negative for itching and rash.  Neurological: Negative for dizziness, tingling, tremors, sensory change, speech change, focal weakness, seizures, loss  of consciousness, weakness and headaches.  Endo/Heme/Allergies: Negative for environmental allergies and polydipsia. Does not bruise/bleed easily.  Psychiatric/Behavioral: Negative for depression, suicidal ideas, hallucinations, memory loss and substance abuse. The patient is not nervous/anxious and does not have insomnia.      History  Past Medical History:  Diagnosis Date  . Cellulitis   . Endometrioma     Past Surgical History:  Procedure Laterality Date  . CESAREAN SECTION    . EXCISION OF ENDOMETRIOMA  12/26/2015   Procedure: RESECTION OF PERIUMBILICAL ENDOMETRIOMA;  Surgeon: Lazaro ArmsLuther H Eure, MD;  Location: [facility];  Service: Gynecology;;  . Leone HavenWISDOM TOOTH EXTRACTION      OB History    Gravida Para Term Preterm AB Living   2 1 1     1    SAB TAB Ectopic Multiple Live Births           1      Obstetric Comments   IVF pregnancy,incision abscess      Allergies  Allergen Reactions  . Penicillins Rash    Has patient had a PCN reaction causing immediate rash, facial/tongue/throat swelling, SOB or lightheadedness with hypotension: Yes Has patient had a PCN reaction causing severe rash involving mucus membranes or skin necrosis: No Has patient had a PCN reaction that required hospitalization No Has patient had a PCN reaction occurring within the last 10 years: No If all of the above answers are "NO", then may proceed with Cephalosporin use.     Social History   Social History  . Marital status: Married    Spouse name: N/A  . Number of children: N/A  . Years of education: N/A   Social History Main Topics  . Smoking status: Never Smoker  . Smokeless tobacco: Never Used  . Alcohol use No     Comment: occassional  . Drug use: No  . Sexual activity: Not Currently    Birth control/ protection: None   Other Topics Concern  . None   Social History Narrative  . None    Family History  Problem Relation Age of Onset  . Hypertension Mother   . Thyroid disease Mother    . Hypertension Father       Blood pressure 133/88, pulse (!) 107, temperature 98.5 F (36.9 C), temperature source Oral, resp. rate 18, height 5\' 9"  (1.753 m), weight 289 lb (131.1 kg), last menstrual period 04/19/2016, SpO2 100 %. Exam Physical Exam  Prenatal labs: ABO, Rh: --/--/O POS, O POS (05/31 1015) Antibody: NEG (05/31 1015) Rubella: 32.10 (11/16 1707) RPR: Non Reactive (05/31 1015)  HBsAg: Negative (11/16 1707)  HIV: Non Reactive (03/14 0858)  WUJ:WJXBJYNWGBS:positive     Assessment/Plan: .5849w0d Estimated Date of Delivery: 04/10/17  Previous Caesarean section Cellulitis right lower extremity(thigh) secondary to lymphatic compression from the pregnancy and lower extremity edema  Declines trial of labor, opts for repeat section Declines tubal ligation   EURE,LUTHER H 04/03/2017, 7:55 AM

## 2017-04-03 NOTE — Progress Notes (Signed)
Pharmacy Antibiotic Note  Ophelia CharterMary Siek is a 36 y.o. female admitted on 04/03/2017 for repeat C-section. Was on clindamycin PTA for cellulitis on the right leg, likely due to lymphatic vessel compression. On admission, the cellulitis had worsened despite clindamycin therapy. Pharmacy has been consulted for vancomycin dosing.  Plan: 1. Vancomycin 2500mg  IV x 1 2. Vancomycin 1250mg  IV Q8h 3. Will draw SCr today and will need to continue to follow.  Height: 5\' 9"  (175.3 cm) Weight: 289 lb (131.1 kg) IBW/kg (Calculated) : 66.2  Temp (24hrs), Avg:98.5 F (36.9 C), Min:98.4 F (36.9 C), Max:98.5 F (36.9 C)   Recent Labs Lab 04/02/17 1015  WBC 8.1    CrCl cannot be calculated (Patient's most recent lab result is older than the maximum 21 days allowed.).    Allergies  Allergen Reactions  . Penicillins Rash    Has patient had a PCN reaction causing immediate rash, facial/tongue/throat swelling, SOB or lightheadedness with hypotension: Yes Has patient had a PCN reaction causing severe rash involving mucus membranes or skin necrosis: No Has patient had a PCN reaction that required hospitalization No Has patient had a PCN reaction occurring within the last 10 years: No If all of the above answers are "NO", then may proceed with Cephalosporin use.     Antimicrobials this admission: Gent/clinda x 1 pre-op vanc 6/1 >>   Microbiology results: None drawn at this time  Thank you for allowing pharmacy to be a part of this patient's care.  Lenore MannerHolcombe, Dalin Caldera [place] 04/03/2017 10:33 AM

## 2017-04-03 NOTE — Anesthesia Postprocedure Evaluation (Signed)
Anesthesia Post Note  Patient: Ophelia CharterMary Patton  Procedure(s) Performed: Procedure(s) (LRB): REPEAT CESAREAN SECTION (N/A)     Patient location during evaluation: Mother Baby Anesthesia Type: Spinal Level of consciousness: awake Pain management: pain level controlled Vital Signs Assessment: post-procedure vital signs reviewed and stable Respiratory status: spontaneous breathing Cardiovascular status: stable Postop Assessment: no headache, no backache, spinal receding, patient able to bend at knees, no signs of nausea or vomiting and adequate PO intake Anesthetic complications: no    Last Vitals:  Vitals:   04/03/17 1235 04/03/17 1340  BP: (!) 100/46 (!) 105/44  Pulse: 83 88  Resp: 16 18  Temp: 36.4 C 36.4 C    Last Pain:  Vitals:   04/03/17 1340  TempSrc: Oral  PainSc:    Pain Goal: Patients Stated Pain Goal: 3 (04/03/17 1116)               Sander Remedios

## 2017-04-04 ENCOUNTER — Encounter (HOSPITAL_COMMUNITY): Payer: Self-pay | Admitting: Behavioral Health

## 2017-04-04 LAB — CBC
HEMATOCRIT: 27.3 % — AB (ref 36.0–46.0)
HEMOGLOBIN: 9.5 g/dL — AB (ref 12.0–15.0)
MCH: 30.1 pg (ref 26.0–34.0)
MCHC: 34.8 g/dL (ref 30.0–36.0)
MCV: 86.4 fL (ref 78.0–100.0)
Platelets: 128 10*3/uL — ABNORMAL LOW (ref 150–400)
RBC: 3.16 MIL/uL — ABNORMAL LOW (ref 3.87–5.11)
RDW: 15.7 % — AB (ref 11.5–15.5)
WBC: 9.1 10*3/uL (ref 4.0–10.5)

## 2017-04-04 MED ORDER — CLINDAMYCIN HCL 300 MG PO CAPS
300.0000 mg | ORAL_CAPSULE | Freq: Three times a day (TID) | ORAL | Status: DC
  Administered 2017-04-04 – 2017-04-06 (×6): 300 mg via ORAL
  Filled 2017-04-04 (×8): qty 1

## 2017-04-04 NOTE — Lactation Note (Signed)
This note was copied from a baby's chart. Lactation Consultation Note  Patient Name: Girl Ophelia CharterMary Bound ZOXWR'UToday's Date: 04/04/2017 Reason for consult: Follow-up assessment;Infant weight loss (5% weight loss, per mom recently breast fed at 2 pm ) LC updated doc flow sheets and reviewed with parents.  Baby presently being held by mom and sleeping.  Per mom nipples are alittle sore, LC recommended to call out for the next feeding for latch assessment .  Latch score range has been 7-9, and the last one was a 9.     Maternal Data    Feeding   LATCH Score/Interventions                Intervention(s): Breastfeeding basics reviewed     Lactation Tools Discussed/Used     Consult Status Consult Status: Follow-up Date: 04/05/17 Follow-up type: In-patient    Matilde SprangMargaret Ann Ridley Dileo 04/04/2017, 3:48 PM

## 2017-04-04 NOTE — Plan of Care (Signed)
Problem: Pain Management: Goal: General experience of comfort will improve and pain level will decrease Patient reports pain level is at 4 or less.  Medication, repositioning, and increasing fluid intake to assist with decreasing fluid retention/edema in lower extremities is being used to assist with decreasing pain as well.    Problem: Bowel/Gastric: Goal: Gastrointestinal status will improve Patient reports she is pretty "gassy", and frequently passing gas.  Patient was educated on the post-surgical expectations related to return of GI motility.

## 2017-04-04 NOTE — Progress Notes (Addendum)
Subjective: Postpartum Day 1: Cesarean Delivery Patient reports continued swelling of legs, R>>L.  Has been on Vancomycin due to pre-delivery dx of "cellulitis" with - w/u for DVT. Marland Kitchen.    Objective: Vital signs in last 24 hours:  Intake/Output Summary (Last 24 hours) at 04/04/17 [id number] Last data filed at 04/04/17 0745  Gross per 24 hour  Intake          3568.84 ml  Output             3475 ml  Net            93.84 ml  received Lasix 40 x 3 doses IV infiltrated, will switch to cleocin  Temp:  [96.9 F (36.1 C)-98.4 F (36.9 C)] 97.9 F (36.6 C) (06/02 0534) Pulse Rate:  [65-102] 65 (06/02 0830) Resp:  [16-19] 18 (06/02 0534) BP: (91-113)/(40-68) 98/50 (06/02 0830) SpO2:  [95 %-100 %] 96 % (06/02 0534)  Physical Exam:  General: alert, cooperative, appears stated age and no distress Lochia: appropriate Uterine Fundus: nontender, cannot feel easily Incision: healing well, no significant drainage, pressure dressing d/c'd DVT Evaluation: No evidence of DVT seen on physical exam. Calf/Ankle edema is present. Pt instructed in leg elevation, and advised to buy support stockings. The Rx for Clindamycin is already sent in by Dr Despina HiddenEure.  Recent Labs  04/02/17 1015 04/04/17 0514  HGB 12.5 9.5*  HCT 37.3 27.3*    Assessment/Plan: Status post Cesarean section. Postoperative course complicated by continued leg edema.  Will stop lasix due to lower bp's  Leg elevation, support stockings. Pt instructed in leg elevation, and advised to buy support stockings. The Rx for Clindamycin is already sent in by Dr Despina HiddenEure. D/c in a.m.  August Gosser V 04/04/2017, 9:19 AM

## 2017-04-05 DIAGNOSIS — O34211 Maternal care for low transverse scar from previous cesarean delivery: Secondary | ICD-10-CM

## 2017-04-05 DIAGNOSIS — Z3A39 39 weeks gestation of pregnancy: Secondary | ICD-10-CM

## 2017-04-05 MED ORDER — IBUPROFEN 600 MG PO TABS: 600.0000 mg | ORAL_TABLET | Freq: Four times a day (QID) | ORAL | 0 refills | Status: DC

## 2017-04-05 MED ORDER — HYDROCHLOROTHIAZIDE 12.5 MG PO CAPS
12.5000 mg | ORAL_CAPSULE | Freq: Every day | ORAL | Status: DC
  Administered 2017-04-06: 12.5 mg via ORAL
  Filled 2017-04-05: qty 1

## 2017-04-05 MED ORDER — OXYCODONE-ACETAMINOPHEN 5-325 MG PO TABS: 1.0000 | ORAL_TABLET | Freq: Four times a day (QID) | ORAL | 0 refills | Status: DC | PRN

## 2017-04-05 MED ORDER — DOCUSATE SODIUM 100 MG PO CAPS: 100.0000 mg | ORAL_CAPSULE | Freq: Two times a day (BID) | ORAL | 2 refills | Status: DC | PRN

## 2017-04-05 NOTE — Discharge Instructions (Signed)

## 2017-04-05 NOTE — Discharge Summary (Signed)
OB Discharge Summary     Patient Name: Sonya Chapman DOB: 03/25/1981 MRN: [id number]  Date of admission: 04/03/2017 Delivering MD: Duane LopeEURE, LUTHER H   Date of discharge: 04/05/2017  Admitting diagnosis: C Intrauterine pregnancy: 359w0d     Secondary diagnosis:  Active Problems:   S/P cesarean section  Additional problems: Leg cellulitis chronic     Discharge diagnosis: Term Pregnancy Delivered                                                                                                Hospital course:  Sceduled C/S   36 y.o. yo G2P2002 at 379w0d was admitted to the hospital 04/03/2017 for scheduled cesarean section with the following indication:Elective Repeat.  Membrane Rupture Time/Date: 8:56 AM ,04/03/2017   Patient delivered a Viable infant.04/03/2017  Details of operation can be found in separate operative note.  Pateint had an uncomplicated postpartum course.  She is ambulating, tolerating a regular diet, passing flatus, and urinating well. Patient is discharged home in stable condition on  04/05/17         Physical exam  Vitals:   04/04/17 0830 04/04/17 1505 04/04/17 1743 04/05/17 0547  BP: (!) 98/50 (!) 99/51 (!) 110/56 (!) 111/56  Pulse: 65 82 83 74  Resp:   18 18  Temp:   98.4 F (36.9 C) 97.8 F (36.6 C)  TempSrc:   Oral Oral  SpO2:      Weight:      Height:       General: alert, cooperative and no distress Lochia: appropriate Uterine Fundus: firm Incision: Dressing is clean, dry, and intact DVT Evaluation: No evidence of DVT seen on physical exam. Negative Homan's sign. Right leg with localized skin edema, non tender to touch Labs: Lab Results  Component Value Date   WBC 9.1 04/04/2017   HGB 9.5 (L) 04/04/2017   HCT 27.3 (L) 04/04/2017   MCV 86.4 04/04/2017   PLT 128 (L) 04/04/2017   CMP Latest Ref Rng & Units 04/03/2017  Glucose 65 - 99 mg/dL -  BUN 6 - 20 mg/dL -  Creatinine 4.090.44 - 8.111.00 mg/dL 9.140.93  Sodium 782135 - 956145 mmol/L -  Potassium 3.5 - 5.1 mmol/L -   Chloride 101 - 111 mmol/L -  CO2 22 - 32 mmol/L -  Calcium 8.9 - 10.3 mg/dL -  Total Protein 6.5 - 8.1 g/dL -  Total Bilirubin 0.3 - 1.2 mg/dL -  Alkaline Phos 38 - 213126 U/L -  AST 15 - 41 U/L -  ALT 14 - 54 U/L -    Discharge instruction: per After Visit Summary and "Baby and Me Booklet".  After visit meds:  Allergies as of 04/05/2017      Reactions   Penicillins Rash   Has patient had a PCN reaction causing immediate rash, facial/tongue/throat swelling, SOB or lightheadedness with hypotension: Yes Has patient had a PCN reaction causing severe rash involving mucus membranes or skin necrosis: No Has patient had a PCN reaction that required hospitalization No Has patient had a PCN reaction occurring within the last 10 years: No  If all of the above answers are "NO", then may proceed with Cephalosporin use.      Medication List    STOP taking these medications   acetaminophen 500 MG tablet Commonly known as:  TYLENOL   aspirin EC 81 MG tablet   TUMS PO     TAKE these medications   clindamycin 300 MG capsule Commonly known as:  CLEOCIN Take 1 capsule (300 mg total) by mouth 3 (three) times daily. What changed:  Another medication with the same name was removed. Continue taking this medication, and follow the directions you see here.   docusate sodium 100 MG capsule Commonly known as:  COLACE Take 1 capsule (100 mg total) by mouth 2 (two) times daily as needed. What changed:  when to take this  reasons to take this   ibuprofen 600 MG tablet Commonly known as:  ADVIL,MOTRIN Take 1 tablet (600 mg total) by mouth every 6 (six) hours.   multivitamin-prenatal 27-0.8 MG Tabs tablet Take 1 tablet by mouth daily at 12 noon.   oxyCODONE-acetaminophen 5-325 MG tablet Commonly known as:  PERCOCET/ROXICET Take 1-2 tablets by mouth every 6 (six) hours as needed.       Diet: routine diet  Activity: Advance as tolerated. Pelvic rest for 6 weeks.   Outpatient follow up:2  weeks Follow up Appt:Future Appointments Date Time Provider Department Center  04/10/2017 9:45 AM Lazaro Arms, MD [facility]   Follow up Visit:No Follow-up on file.  Postpartum contraception: Undecided  Newborn Data: Live born female  Birth Weight: 9 lb 1.9 oz (4135 g) APGAR: 8, 9  Baby Feeding: Bottle and Breast Disposition:home with mother   04/05/2017 Catalina Antigua, MD

## 2017-04-05 NOTE — Progress Notes (Signed)
Called to see pt regarding edema of left thigh. 2+ pitting edema noted to interior aspect of left thigh. Mild erythema and tenderness noted. No evidence of cellulitis to left thigh. 1+edema to upper aspect left calf. Homans negative. Discussed w/pt potentially start diuretic to assist.  Consult with Dr. Tawanna Cooleronstant-plan for low dose HCTZ. Pt has long term hx of cellulitis in rt thigh, recommend interval f/u with ID.

## 2017-04-05 NOTE — Lactation Note (Signed)
This note was copied from a baby's chart. Lactation Consultation Note  Patient Name: Sonya Chapman [place] ZOXWR'UToday's Date: 04/05/2017 Reason for consult: Follow-up assessment;Breast/nipple pain;Infant weight loss  48 hrs old, 9% weight loss, output great with multiple stools and voids. History of low milk supply at 2 months with first baby.  History of IVF/infertility Assist/assess latch in cradle hold.  Mom has coconut oil at bedside due to complaints of soreness.  No skin breakdown noted. (Colostrum dripping from breast prior to latching baby)  LC assisted Mom to change to cross cradle to better control latch, and sandwich breast to accommodate a deep areolar grasp.  Baby latched easily.  Nutritive suck/swallow pattern identified.  Demonstrated alternate breast compression to increase milk transfers/swallowing.  Encouraged feeding on both breasts at each feeding, using STS to help keep baby awake.  Mom to try to feed baby often today by keeping baby STS between feedings. Discussed history of low milk supply, LC recommended adding breast massage,hand expression/double pumping to feeding plan after baby breastfeeds.  Mom agreeable. To supplement baby with any EBM by spoon, cup, or syringe.  Mom to call for assistance with this prn. Set up DEBP with instructions on use, cleaning and storage of milk.  Mom to give any milk back to baby. LC to follow-up prn, and in am.  Feeding Feeding Type: Breast Fed  LATCH Score/Interventions Latch: Grasps breast easily, tongue down, lips flanged, rhythmical sucking.  Audible Swallowing: Spontaneous and intermittent Intervention(s): Alternate breast massage;Hand expression;Skin to skin  Type of Nipple: Everted at rest and after stimulation  Comfort (Breast/Nipple): Filling, red/small blisters or bruises, mild/mod discomfort  Problem noted: Mild/Moderate discomfort Interventions (Mild/moderate discomfort): Hand massage;Hand expression;Post-pump  Hold  (Positioning): Assistance needed to correctly position infant at breast and maintain latch. Intervention(s): Breastfeeding basics reviewed;Support Pillows;Position options;Skin to skin  LATCH Score: 8  Lactation Tools Discussed/Used Tools: Pump Breast pump type: Double-Electric Breast Pump Pump Review: Setup, frequency, and cleaning;Milk Storage Initiated by:: Johny Blameraroline Lyle Leisner RN IBCLC Date initiated:: 04/05/17   Consult Status Consult Status: Follow-up Date: 04/06/17 Follow-up type: In-patient    Judee ClaraSmith, Valene Villa E 04/05/2017, 9:09 AM

## 2017-04-06 ENCOUNTER — Encounter: Payer: Self-pay | Admitting: Obstetrics & Gynecology

## 2017-04-06 MED ORDER — HYDROCHLOROTHIAZIDE 12.5 MG PO CAPS: 12.5000 mg | ORAL_CAPSULE | Freq: Every day | ORAL | 0 refills | Status: DC

## 2017-04-06 NOTE — Discharge Summary (Signed)
OB Discharge Summary     Patient Name: Sonya Chapman DOB: 06/06/1981 MRN: [id number]  Date of admission: 04/03/2017 Delivering MD: Duane LopeEURE, LUTHER H   Date of discharge: 04/06/2017  Admitting diagnosis: C Intrauterine pregnancy: [id number]     Secondary diagnosis:  Active Problems:   S/P cesarean section  Additional problems: Leg cellulitis chronic     Discharge diagnosis: Term Pregnancy Delivered                                   Hospital course:  Sceduled C/S   36 y.o. yo G2P2002 at 2167w0d was admitted to the hospital 04/03/2017 for scheduled cesarean section with the following indication:Elective Repeat.  Membrane Rupture Time/Date: 8:56 AM ,04/03/2017   Patient delivered a Viable infant.04/03/2017  Details of operation can be found in separate operative note.  Pateint had an uncomplicated postpartum course.  She is ambulating, tolerating a regular diet, passing flatus, and urinating well. Patient is discharged home in stable condition on  04/05/17 LE Edema and Cellulitis: Patient noted to have significant bilateral LE edema and right LE cellulitis throughout hospital stay. DVT was ruled out. She was sent with Clindamycin as well as HCTZ for a 5 day course to treat the edema.  Physical exam  Vitals:   04/04/17 1743 04/05/17 0547 04/05/17 1739 04/06/17 0524  BP: (!) 110/56 (!) 111/56 (!) 114/56 (!) 115/51  Pulse: 83 74 88 72  Resp: 18 18 18 20   Temp: 98.4 F (36.9 C) 97.8 F (36.6 C) 98 F (36.7 C) 97.8 F (36.6 C)  TempSrc: Oral Oral Oral Oral  SpO2:      Weight:      Height:       General: alert, cooperative and no distress Lochia: appropriate Uterine Fundus: firm Incision: Healing well with no significant drainage, honeycomb in place DVT Evaluation: Negative Homan's sign, +bil LE edema extends to thighs with mild redness on right, no warmth, no drainage Labs: Lab Results  Component Value Date   WBC 9.1 04/04/2017   HGB 9.5 (L) 04/04/2017   HCT 27.3 (L) 04/04/2017   MCV 86.4  04/04/2017   PLT 128 (L) 04/04/2017   CMP Latest Ref Rng & Units 04/03/2017  Glucose 65 - 99 mg/dL -  BUN 6 - 20 mg/dL -  Creatinine 4.090.44 - 8.111.00 mg/dL 9.140.93  Sodium 782135 - 956145 mmol/L -  Potassium 3.5 - 5.1 mmol/L -  Chloride 101 - 111 mmol/L -  CO2 22 - 32 mmol/L -  Calcium 8.9 - 10.3 mg/dL -  Total Protein 6.5 - 8.1 g/dL -  Total Bilirubin 0.3 - 1.2 mg/dL -  Alkaline Phos 38 - 213126 U/L -  AST 15 - 41 U/L -  ALT 14 - 54 U/L -    Discharge instruction: per After Visit Summary and "Baby and Me Booklet".  After visit meds:  Allergies as of 04/06/2017      Reactions   Penicillins Rash   Has patient had a PCN reaction causing immediate rash, facial/tongue/throat swelling, SOB or lightheadedness with hypotension: Yes Has patient had a PCN reaction causing severe rash involving mucus membranes or skin necrosis: No Has patient had a PCN reaction that required hospitalization No Has patient had a PCN reaction occurring within the last 10 years: No If all of the above answers are "NO", then may proceed with Cephalosporin use.      Medication List  STOP taking these medications   acetaminophen 500 MG tablet Commonly known as:  TYLENOL   aspirin EC 81 MG tablet   TUMS PO     TAKE these medications   clindamycin 300 MG capsule Commonly known as:  CLEOCIN Take 1 capsule (300 mg total) by mouth 3 (three) times daily. What changed:  Another medication with the same name was removed. Continue taking this medication, and follow the directions you see here.   docusate sodium 100 MG capsule Commonly known as:  COLACE Take 1 capsule (100 mg total) by mouth 2 (two) times daily as needed. What changed:  when to take this  reasons to take this   hydrochlorothiazide 12.5 MG capsule Commonly known as:  MICROZIDE Take 1 capsule (12.5 mg total) by mouth daily.   ibuprofen 600 MG tablet Commonly known as:  ADVIL,MOTRIN Take 1 tablet (600 mg total) by mouth every 6 (six) hours.    multivitamin-prenatal 27-0.8 MG Tabs tablet Take 1 tablet by mouth daily at 12 noon.   oxyCODONE-acetaminophen 5-325 MG tablet Commonly known as:  PERCOCET/ROXICET Take 1-2 tablets by mouth every 6 (six) hours as needed.       Diet: routine diet  Activity: Advance as tolerated. Pelvic rest for 6 weeks.   Outpatient follow up:2 weeks Follow up Appt: Future Appointments Date Time Provider Department Center  04/10/2017 9:45 AM Lazaro Arms, MD [facility]   Follow up Visit:No Follow-up on file.  Postpartum contraception: Undecided  Newborn Data: Live born female  Birth Weight: 9 lb 1.9 oz (4135 g) APGAR: 8, 9  Baby Feeding: Bottle and Breast Disposition:home with mother   04/06/2017 Howard Pouch, MD  Midwife attestation I have seen and examined this patient and agree with above documentation in the resident's note.   Sonya Chapman is a 36 y.o. G2P2002 s/p CS.   Pain is well controlled.  Plan for birth control is no method.  Method of Feeding: breast  PE:  Gen: well appearing Heart: reg rate Lungs: normal WOB Fundus firm Ext: soft, no pain, no edema   Recent Labs  04/04/17 0514  HGB 9.5*  HCT 27.3*     Assessment - discharge today  Plan: - postpartum care discussed - f/u in OB clinic in 4 days  - interval f/u with ID   Donette Larry, CNM 8:05 AM

## 2017-04-06 NOTE — Lactation Note (Signed)
This note was copied from a baby's chart. Lactation Consultation Note  Patient Name: Girl Ophelia CharterMary Clapham Today's Date: 04/06/2017  Mom states baby is latching well and feeding without problems.  She has a history of low milk supply with her first so she is post pumping and obtaining small amounts of colostrum.  Parents are also supplementing with alimentum due to 9% weight loss.  Discharge teaching done including engorgement treatment.  Lactation outpatient services and support information reviewed and encouraged prn.   Maternal Data    Feeding Feeding Type: Formula Nipple Type: Slow - flow  LATCH Score/Interventions                      Lactation Tools Discussed/Used     Consult Status      Huston FoleyMOULDEN, Karder Goodin S 04/06/2017, 9:17 AM

## 2017-04-10 ENCOUNTER — Encounter: Payer: Self-pay | Admitting: Obstetrics & Gynecology

## 2017-04-10 ENCOUNTER — Ambulatory Visit (INDEPENDENT_AMBULATORY_CARE_PROVIDER_SITE_OTHER): Payer: BLUE CROSS/BLUE SHIELD | Admitting: Obstetrics & Gynecology

## 2017-04-10 VITALS — BP 138/90 | HR 91 | Wt 272.0 lb

## 2017-04-10 DIAGNOSIS — L03115 Cellulitis of right lower limb: Secondary | ICD-10-CM

## 2017-04-10 DIAGNOSIS — Z9889 Other specified postprocedural states: Secondary | ICD-10-CM

## 2017-04-10 DIAGNOSIS — Z98891 History of uterine scar from previous surgery: Secondary | ICD-10-CM

## 2017-04-12 NOTE — Progress Notes (Signed)
  HPI: Patient returns for routine postoperative follow-up having undergone Repeat Caesarean section  on 04/03/2017.  The patient's immediate postoperative recovery has been unremarkable. Since hospital discharge the patient reports her cellulitis of her right leg is better, still not normal but the edema is improving which secondarily is improving her cellulitis.   Current Outpatient Prescriptions: clindamycin (CLEOCIN) 300 MG capsule, Take 1 capsule (300 mg total) by mouth 3 (three) times daily., Disp: 30 capsule, Rfl: 1 docusate sodium (COLACE) 100 MG capsule, Take 1 capsule (100 mg total) by mouth 2 (two) times daily as needed., Disp: 30 capsule, Rfl: 2 ibuprofen (ADVIL,MOTRIN) 600 MG tablet, Take 1 tablet (600 mg total) by mouth every 6 (six) hours., Disp: 30 tablet, Rfl: 0 Prenatal Vit-Fe Fumarate-FA (MULTIVITAMIN-PRENATAL) 27-0.8 MG TABS tablet, Take 1 tablet by mouth daily at 12 noon., Disp: , Rfl:  hydrochlorothiazide (MICROZIDE) 12.5 MG capsule, Take 1 capsule (12.5 mg total) by mouth daily. (Patient not taking: Reported on 04/10/2017), Disp: 5 capsule, Rfl: 0 oxyCODONE-acetaminophen (PERCOCET/ROXICET) 5-325 MG tablet, Take 1-2 tablets by mouth every 6 (six) hours as needed. (Patient not taking: Reported on 04/10/2017), Disp: 30 tablet, Rfl: 0  No current facility-administered medications for this visit.     Blood pressure 138/90, pulse 91, weight 272 lb (123.4 kg), last menstrual period 04/19/2016, currently breastfeeding.  Physical Exam: Incision clean dry intact Right leg with improving cellulitis  Diagnostic Tests:   Pathology:   Impression: S/p repeat C section 04/03/2017 Cellulitis, mild, of right leg: improving  Plan: Stop clindamycin Return if the cellulitis worsens  Follow up: 2  weeks  Lazaro ArmsEURE,LUTHER H, MD

## 2017-04-24 ENCOUNTER — Ambulatory Visit (INDEPENDENT_AMBULATORY_CARE_PROVIDER_SITE_OTHER): Payer: BLUE CROSS/BLUE SHIELD | Admitting: Obstetrics & Gynecology

## 2017-04-24 ENCOUNTER — Encounter: Payer: Self-pay | Admitting: Obstetrics & Gynecology

## 2017-04-24 VITALS — BP 136/86 | HR 68 | Wt 238.0 lb

## 2017-04-24 DIAGNOSIS — Z98891 History of uterine scar from previous surgery: Secondary | ICD-10-CM

## 2017-04-24 DIAGNOSIS — L03115 Cellulitis of right lower limb: Secondary | ICD-10-CM

## 2017-04-24 DIAGNOSIS — Z9889 Other specified postprocedural states: Secondary | ICD-10-CM

## 2017-04-24 MED ORDER — TRIAMTERENE-HCTZ 37.5-25 MG PO CAPS: 1.0000 | ORAL_CAPSULE | Freq: Every day | ORAL | 1 refills | Status: DC

## 2017-04-24 NOTE — Progress Notes (Signed)
  HPI: Patient returns for routine postoperative follow-up having undergone repeat cesarean section on 04/03/2017.  The patient's immediate postoperative recovery has been unremarkable. Since hospital discharge the patient reports right leg cellulitis is much improved, still has a bothersome amount of edema in the lower leg and foot some days at the end of the day.  But the cellulitis is essentially completely cleared   Current Outpatient Prescriptions: ibuprofen (ADVIL,MOTRIN) 600 MG tablet, Take 1 tablet (600 mg total) by mouth every 6 (six) hours., Disp: 30 tablet, Rfl: 0 Prenatal Vit-Fe Fumarate-FA (MULTIVITAMIN-PRENATAL) 27-0.8 MG TABS tablet, Take 1 tablet by mouth daily at 12 noon., Disp: , Rfl:  Probiotic Product (PROBIOTIC DAILY PO), Take by mouth., Disp: , Rfl:  hydrochlorothiazide (MICROZIDE) 12.5 MG capsule, Take 1 capsule (12.5 mg total) by mouth daily. (Patient not taking: Reported on 04/10/2017), Disp: 5 capsule, Rfl: 0  No current facility-administered medications for this visit.     Blood pressure 136/86, pulse 68, weight 238 lb (108 kg), currently breastfeeding.  Physical Exam: Abdomen is soft benign Incision clean dry and intact Right leg is doing well no evidence of cellulitis this point in the right inner thigh  Diagnostic Tests:   Pathology: n/a  Impression: Status post repeat C-section, 3 weeks ago Right leg cellulitis secondary to lymphatic compression, resolved   Plan: We'll give Carleigh a prescription for diuretic use as needed when she has particularly bothersome swelling days of the right lower leg  Follow up: 4  weeks postpartum visit  Lazaro ArmsEURE,Dvora Buitron H, MD

## 2017-05-15 ENCOUNTER — Encounter: Payer: Self-pay | Admitting: Women's Health

## 2017-05-15 ENCOUNTER — Ambulatory Visit (INDEPENDENT_AMBULATORY_CARE_PROVIDER_SITE_OTHER): Payer: BLUE CROSS/BLUE SHIELD | Admitting: Women's Health

## 2017-05-15 DIAGNOSIS — Z8759 Personal history of other complications of pregnancy, childbirth and the puerperium: Secondary | ICD-10-CM

## 2017-05-15 DIAGNOSIS — O165 Unspecified maternal hypertension, complicating the puerperium: Secondary | ICD-10-CM | POA: Insufficient documentation

## 2017-05-15 DIAGNOSIS — R42 Dizziness and giddiness: Secondary | ICD-10-CM | POA: Diagnosis not present

## 2017-05-15 DIAGNOSIS — N61 Mastitis without abscess: Secondary | ICD-10-CM | POA: Insufficient documentation

## 2017-05-15 DIAGNOSIS — Z98891 History of uterine scar from previous surgery: Secondary | ICD-10-CM

## 2017-05-15 LAB — POCT HEMOGLOBIN: HEMOGLOBIN: 12.5 g/dL (ref 12.2–16.2)

## 2017-05-15 MED ORDER — AMLODIPINE BESYLATE 5 MG PO TABS: 5.0000 mg | ORAL_TABLET | Freq: Every day | ORAL | 3 refills | Status: DC

## 2017-05-15 MED ORDER — CLINDAMYCIN HCL 300 MG PO CAPS
300.0000 mg | ORAL_CAPSULE | Freq: Three times a day (TID) | ORAL | 0 refills | Status: DC
Start: 2017-05-15 — End: 2017-05-29

## 2017-05-15 NOTE — Patient Instructions (Addendum)
Stop taking norvasc 2 days before your next visit  Tips To Increase Milk Supply  Lots of water! Enough so that your urine is clear  Plenty of calories, if you're not getting enough calories, your milk supply can decrease  Breastfeed/pump often, every 2-3 hours x 20-7930mins  Fenugreek 3 pills 3 times a day, this may make your urine smell like maple syrup  Mother's Milk Tea  Lactation cookies, google for the recipe  Real oatmeal   For Dizzy Spells:   This is usually related to either your blood sugar or your blood pressure dropping  Make sure you are staying well hydrated and drinking enough water so that your urine is clear  Eat small frequent meals and snacks containing protein (meat, eggs, nuts, cheese) so that your blood sugar doesn't drop  If you do get dizzy, sit/lay down and get you something to drink and a snack containing protein- you will usually start feeling better in 10-20 minutes   Mastitis Mastitis is inflammation of the breast tissue. It occurs most often in women who are breastfeeding, but it can also affect other women, and even sometimes men. What are the causes? Mastitis is usually caused by a bacterial infection. Bacteria enter the breast tissue through cuts or openings in the skin. Typically, this occurs with breastfeeding because of cracked or irritated skin. Sometimes, it can occur even when there is no opening in the skin. It can be associated with plugged milk (lactiferous) ducts. Nipple piercing can also lead to mastitis. Also, some forms of breast cancer can cause mastitis. What are the signs or symptoms?  Swelling, redness, tenderness, and pain in an area of the breast.  Swelling of the glands under the arm on the same side.  Fever. If an infection is allowed to progress, a collection of pus (abscess) may develop. How is this diagnosed? Your health care provider can usually diagnose mastitis based on your symptoms and a physical exam. Tests may be  done to help confirm the diagnosis. These may include:  Removal of pus from the breast by applying pressure to the area. This pus can be examined in the lab to determine which bacteria are present. If an abscess has developed, the fluid in the abscess can be removed with a needle. This can also be used to confirm the diagnosis and determine the bacteria present. In most cases, pus will not be present.  Blood tests to determine if your body is fighting a bacterial infection.  Mammogram or ultrasound tests to rule out other problems or diseases.  How is this treated? Antibiotic medicine is used to treat a bacterial infection. Your health care provider will determine which bacteria are most likely causing the infection and will select an appropriate antibiotic. This is sometimes changed based on the results of tests performed to identify the bacteria, or if there is no response to the antibiotic selected. Antibiotics are usually given by mouth. You may also be given medicine for pain. Mastitis that occurs with breastfeeding will sometimes go away on its own, so your health care provider may choose to wait 24 hours after first seeing you to decide whether a prescription medicine is needed. Follow these instructions at home:  Only take over-the-counter or prescription medicines for pain, fever, or discomfort as directed by your health care provider.  If your health care provider prescribed an antibiotic, take the medicine as directed. Make sure you finish it even if you start to feel better.  Do not  wear a tight or underwire bra. Wear a soft, supportive bra.  Increase your fluid intake, especially if you have a fever.  Women who are breastfeeding should follow these instructions: ? Continue to empty the breast. Your health care provider can tell you whether this milk is safe for your infant or needs to be thrown out. You may be told to stop nursing until your health care provider thinks it is safe  for your baby. Use a breast pump if you are advised to stop nursing. ? Keep your nipples clean and dry. ? Empty the first breast completely before going to the other breast. If your baby is not emptying your breasts completely for some reason, use a breast pump to empty your breasts. ? If you go back to work, pump your breasts while at work to stay in time with your nursing schedule. ? Avoid allowing your breasts to become overly filled with milk (engorged). Contact a health care provider if:  You have pus-like discharge from the breast.  Your symptoms do not improve with the treatment prescribed by your health care provider within 2 days. Get help right away if:  Your pain and swelling are getting worse.  You have pain that is not controlled with medicine.  You have a red line extending from the breast toward your armpit.  You have a fever or persistent symptoms for more than 2-3 days.  You have a fever and your symptoms suddenly get worse. This information is not intended to replace advice given to you by your health care provider. Make sure you discuss any questions you have with your health care provider. Document Released: 10/20/2005 Document Revised: 03/27/2016 Document Reviewed: 05/20/2013 Elsevier Interactive Patient Education  2017 [employer].

## 2017-05-15 NOTE — Progress Notes (Addendum)
Subjective:    Sonya Chapman is a 36 y.o. 162P2002 Caucasian female who presents for a postpartum visit. She is 5 weeks postpartum following a repeat cesarean section, low transverse incision at 39.0 gestational weeks. Anesthesia: spinal. I have fully reviewed the prenatal and intrapartum course. Postpartum course has been complicated by cellulitis Rt leg during pregnancy/postpartum- completely resolved now. Pregnancy complicated by borderline GHTN. Has rx for HCTZ prn at home for fluid in legs, hasn't had to take b/c edema is good, and it also decreases her milk supply.  Baby's course has been uncomplicated. Baby is feeding by breast. Pt has hard lump and redness Lt breast x few days. No fever/chills, body aches. Bleeding no bleeding. Bowel function is normal. Bladder function is normal. Patient is not sexually active. Last sexual activity: prior to birth of baby. Contraception method is nothing, had to do embryo transfer for pregnancy. Postpartum depression screening: negative. Score 4.  Last pap 10/23/15 and was neg. Pt denies any problems w/ HTN outside of pregnancy. Some dizziness.   The following portions of the patient's history were reviewed and updated as appropriate: allergies, current medications, past medical history, past surgical history and problem list.  Review of Systems Pertinent items are noted in HPI.   Vitals:   05/15/17 1002 05/15/17 1030  BP: 140/82 (!) 142/90  Pulse: 64   Weight: 240 lb 8 oz (109.1 kg)   Height: 5\' 9"  (1.753 m)    BP recheck 142/90  No LMP recorded.  Objective:   General:  alert, cooperative and no distress   Breasts:  Hard erythematous area ~2 o'clock c/w mastitis  Lungs: clear to auscultation bilaterally  Heart:  regular rate and rhythm  Abdomen: soft, nontender; c/s well-healed   Vulva: normal  Vagina: normal vagina  Cervix:  closed  Corpus: Well-involuted  Adnexa:  Non-palpable  Rectal Exam: No hemorrhoids        Fingerstick Hgb  12.5  Assessment:   Postpartum exam 5 wks s/p RLTCS Resolved RLE cellulitis  Mastitis PP HTN Dizziness Breastfeeding Depression screening Contraception counseling   Plan:  Contraception: none  Rx norvasc 5mg  daily, stop taking 2d prior to f/u visit Rx clindamycin 300mg  TID x 7d for mastitis (allergic to pcn so couldn't use dicloxacillin or keflex) Gave info on dizzy spells, increase hydration, eat proteins when eats carbs Follow up in: 2 weeks for visit/bp check, or earlier if needed  Marge DuncansBooker, Sonya Chapman CNM, [facility] 05/15/2017 1:53 PM

## 2017-05-22 ENCOUNTER — Ambulatory Visit: Payer: BLUE CROSS/BLUE SHIELD | Admitting: Women's Health

## 2017-05-29 ENCOUNTER — Encounter: Payer: Self-pay | Admitting: Obstetrics & Gynecology

## 2017-05-29 ENCOUNTER — Ambulatory Visit (INDEPENDENT_AMBULATORY_CARE_PROVIDER_SITE_OTHER): Payer: BLUE CROSS/BLUE SHIELD | Admitting: Obstetrics & Gynecology

## 2017-05-29 VITALS — BP 140/90 | HR 80 | Wt 240.0 lb

## 2017-05-29 DIAGNOSIS — Z0130 Encounter for examination of blood pressure without abnormal findings: Secondary | ICD-10-CM

## 2017-05-29 MED ORDER — NORETHINDRONE 0.35 MG PO TABS: 1.0000 | ORAL_TABLET | Freq: Every day | ORAL | 11 refills | Status: DC

## 2017-05-29 NOTE — Progress Notes (Signed)
Chief Complaint  Patient presents with  . Follow-up    BP check    Blood pressure 140/90, pulse 80, weight 240 lb (108.9 kg), currently breastfeeding.  36 y.o. [id number] No LMP recorded. The current method of family planning is none, prescribing POP today.  Outpatient Encounter Prescriptions as of 05/29/2017  Medication Sig  . FENUGREEK PO Take by mouth daily as needed.  . Prenatal Vit-Fe Fumarate-FA (MULTIVITAMIN-PRENATAL) 27-0.8 MG TABS tablet Take 1 tablet by mouth daily at 12 noon.  Marland Kitchen. amLODipine (NORVASC) 5 MG tablet Take 1 tablet (5 mg total) by mouth daily. (Patient not taking: Reported on 05/29/2017)  . hydrochlorothiazide (MICROZIDE) 12.5 MG capsule Take 1 capsule (12.5 mg total) by mouth daily. (Patient not taking: Reported on 04/10/2017)  . norethindrone (MICRONOR,CAMILA,ERRIN) 0.35 MG tablet Take 1 tablet (0.35 mg total) by mouth daily. Take 1 a day  . [DISCONTINUED] clindamycin (CLEOCIN) 300 MG capsule Take 1 capsule (300 mg total) by mouth 3 (three) times daily. X 7 days (Patient not taking: Reported on 05/29/2017)  . [DISCONTINUED] ibuprofen (ADVIL,MOTRIN) 600 MG tablet Take 1 tablet (600 mg total) by mouth every 6 (six) hours. (Patient not taking: Reported on 05/29/2017)   No facility-administered encounter medications on file as of 05/29/2017.     Subjective Patient reports that she d/c Norvasc about two days prior to this OV. She checked her BP a couple days ago and it was 121/70.  C-section incision site is healing well and not causing any problems.  Cellulitis of right LE has resolved. She reports some persistent edema of right ankle as well as some ankle/foot pain with inversion of the ankle.   Objective Gen: WDWN female in NAD  Skin: No erythema, rash or increased warmth of right thigh. Abdominal incision is clean and dry without drainage and healing well.  Ext: No edema of LE. 2+ pedal pulses.   Pertinent ROS No headaches, chest pain, or vision changes.    Impression Diagnoses this Encounter::   ICD-10-CM   1. BP check Z01.30     Established relevant diagnosis(es): History of gestational HTN   Plan/Recommendations: Meds ordered this encounter  Medications  . norethindrone (MICRONOR,CAMILA,ERRIN) 0.35 MG tablet    Sig: Take 1 tablet (0.35 mg total) by mouth daily. Take 1 a day    Dispense:  1 Package    Refill:  11   Discontinue Norvasc. Monitor BP 1-2 times per day.  Ankle sleeve for ankle pain. Will refer to SM if pain persists.   Labs or Scans Ordered: No orders of the defined types were placed in this encounter.   Follow up Return in about 6 months (around 11/29/2017) for yearly.   Past Medical History:  Diagnosis Date  . Cellulitis   . Endometrioma   . Hypertension     Past Surgical History:  Procedure Laterality Date  . CESAREAN SECTION    . CESAREAN SECTION N/A 04/03/2017   Procedure: REPEAT CESAREAN SECTION;  Surgeon: Lazaro ArmsEure, Luther H, MD;  [facility];  Service: Obstetrics;  Laterality: N/A;  . EXCISION OF ENDOMETRIOMA  12/26/2015   Procedure: RESECTION OF PERIUMBILICAL ENDOMETRIOMA;  Surgeon: Lazaro ArmsLuther H Eure, MD;  Location: [facility];  Service: Gynecology;;  . Leone HavenWISDOM TOOTH EXTRACTION      OB History    Gravida Para Term Preterm AB Living   2 2 2     2    SAB TAB Ectopic Multiple Live Births  0 2      Obstetric Comments   IVF pregnancy,incision abscess      Allergies  Allergen Reactions  . Penicillins Rash    Has patient had a PCN reaction causing immediate rash, facial/tongue/throat swelling, SOB or lightheadedness with hypotension: Yes Has patient had a PCN reaction causing severe rash involving mucus membranes or skin necrosis: No Has patient had a PCN reaction that required hospitalization No Has patient had a PCN reaction occurring within the last 10 years: No If all of the above answers are "NO", then may proceed with Cephalosporin use.     Social History   Social  History  . Marital status: Married    Spouse name: N/A  . Number of children: N/A  . Years of education: N/A   Social History Main Topics  . Smoking status: Never Smoker  . Smokeless tobacco: Never Used  . Alcohol use No     Comment: occassional  . Drug use: No  . Sexual activity: Not Currently    Birth control/ protection: None   Other Topics Concern  . None   Social History Narrative  . None    Family History  Problem Relation Age of Onset  . Hypertension Mother   . Thyroid disease Mother   . Hypertension Father

## 2017-06-01 IMAGING — CT CT ABDOMEN W/O CM
2 of 4 series · 15 of 46 positions shown, 17 images · non-contrast
Comparison: None.

CLINICAL DATA: Painful left periumbilical abdominal mass during
menses. Suspect endometrioma.

EXAM:
CT ABDOMEN WITHOUT CONTRAST
TECHNIQUE: Multidetector CT imaging of the abdomen was performed following the
standard protocol without IV contrast.

[Series 2: abdomen/pelvis w/o contrast · axial · non-contrast · 0.78mm/px · z∈[-248,-8]mm · 12 of 56 slices shown, 14 images]
[im 4/56  soft-tissue]
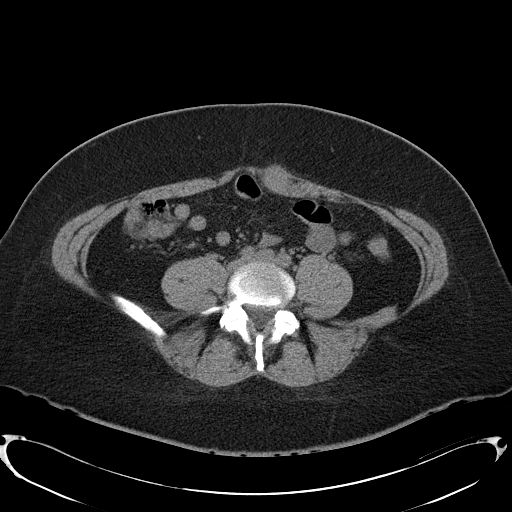
[im 4/56  bone]
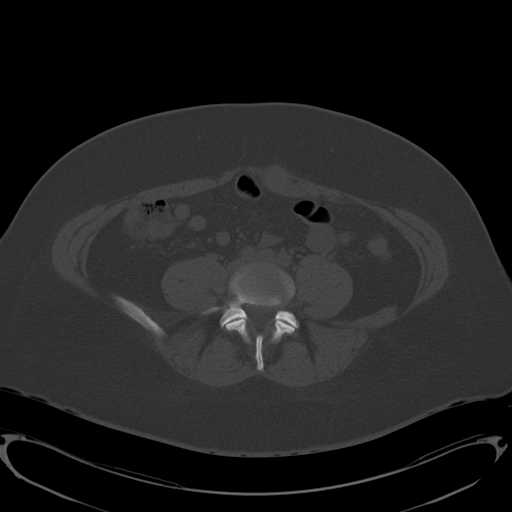
[im 8/56  soft-tissue]
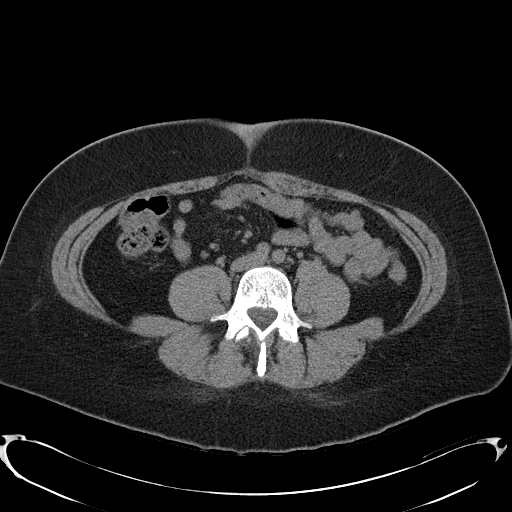
[im 12/56  soft-tissue]
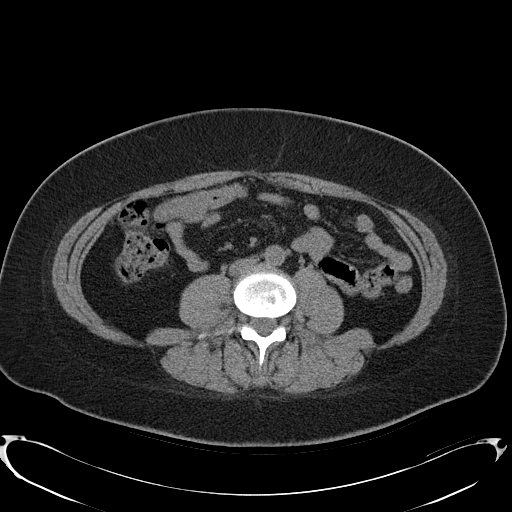
[im 19/56  soft-tissue]
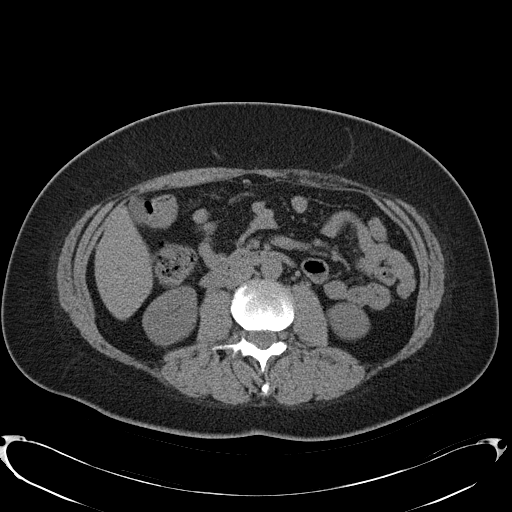
[im 23/56  soft-tissue]
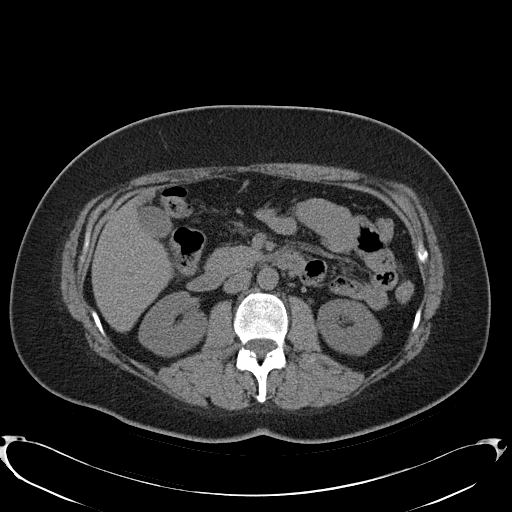
[im 26/56  soft-tissue]
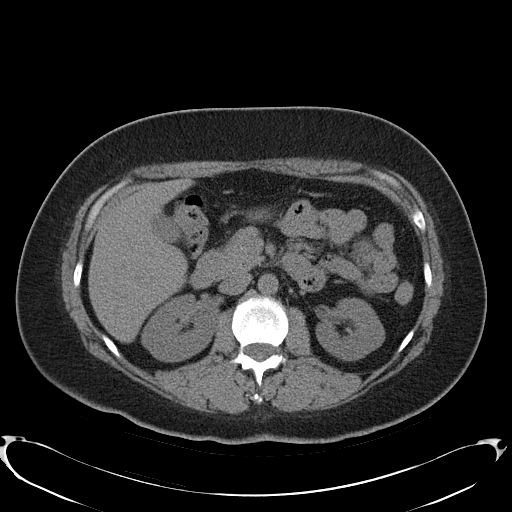
[im 30/56  soft-tissue]
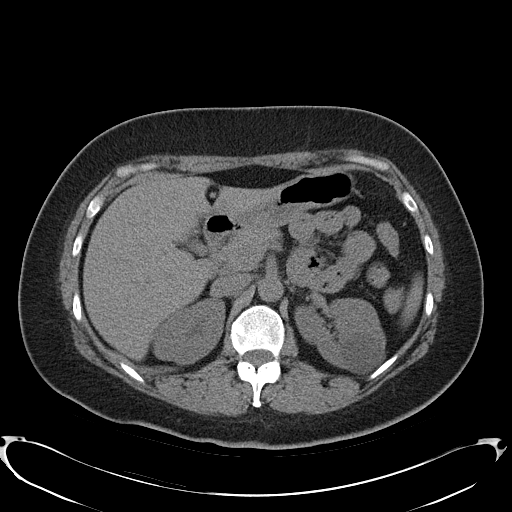
[im 34/56  soft-tissue]
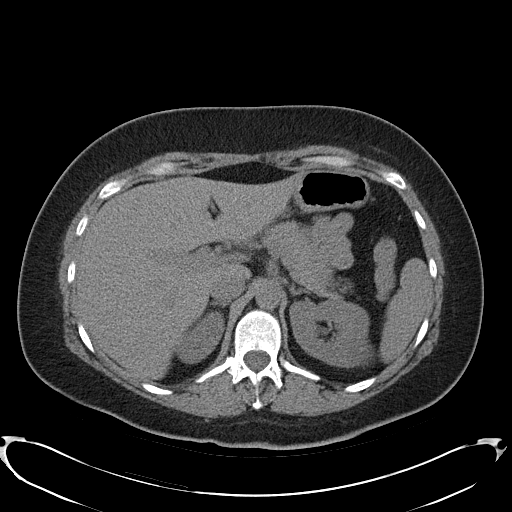
[im 37/56  soft-tissue]
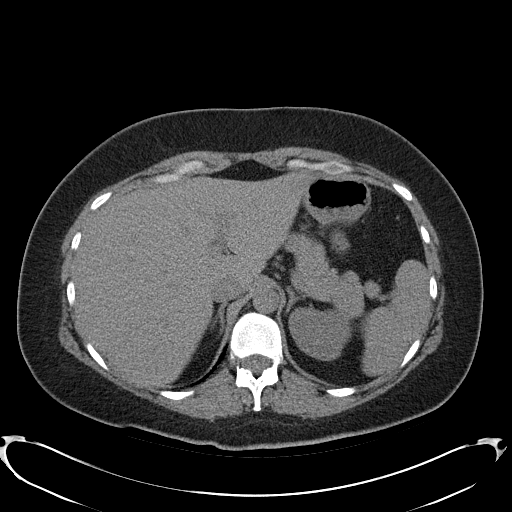
[im 37/56  bone]
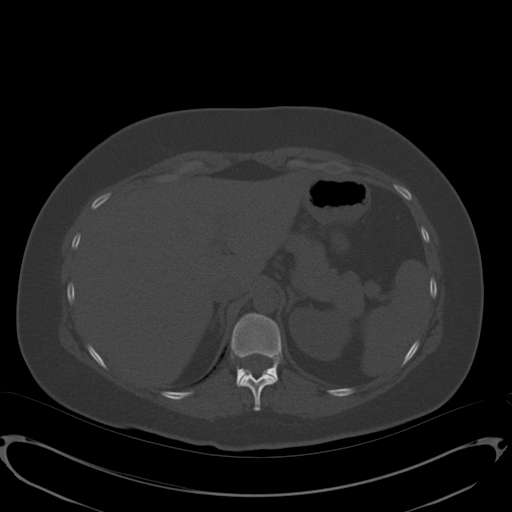
[im 45/56  soft-tissue]
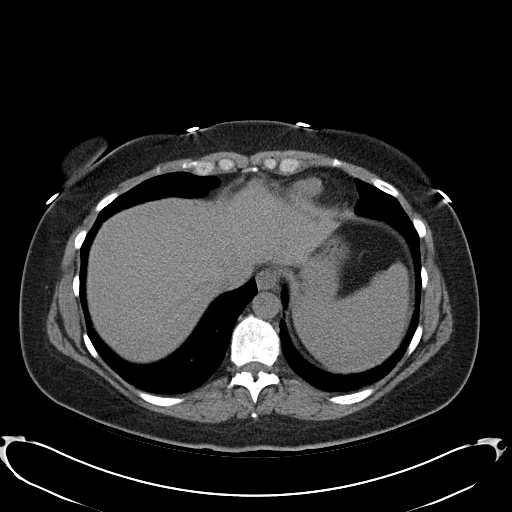
[im 48/56  soft-tissue]
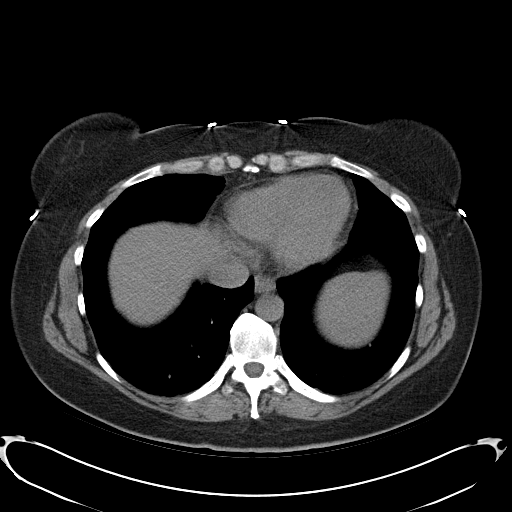
[im 52/56  soft-tissue]
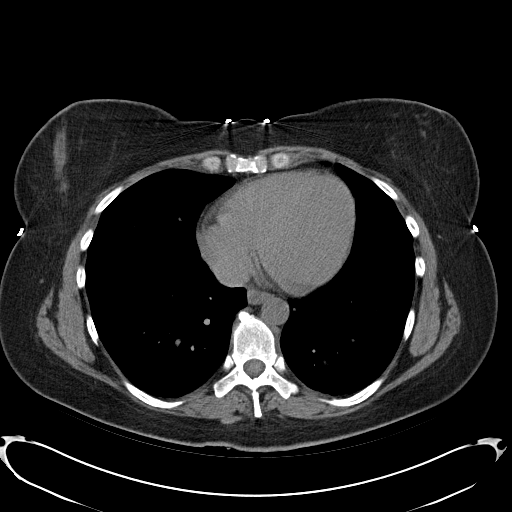

[Series 3: mpr cor 3.0mm · coronal · 0.54mm/px · 3 of 82 slices shown]
[im 28/82  soft-tissue]
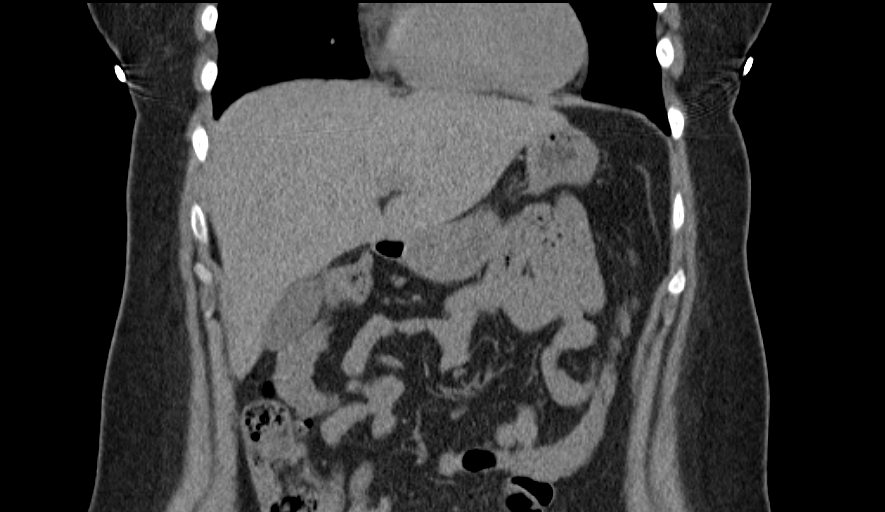
[im 37/82  soft-tissue]
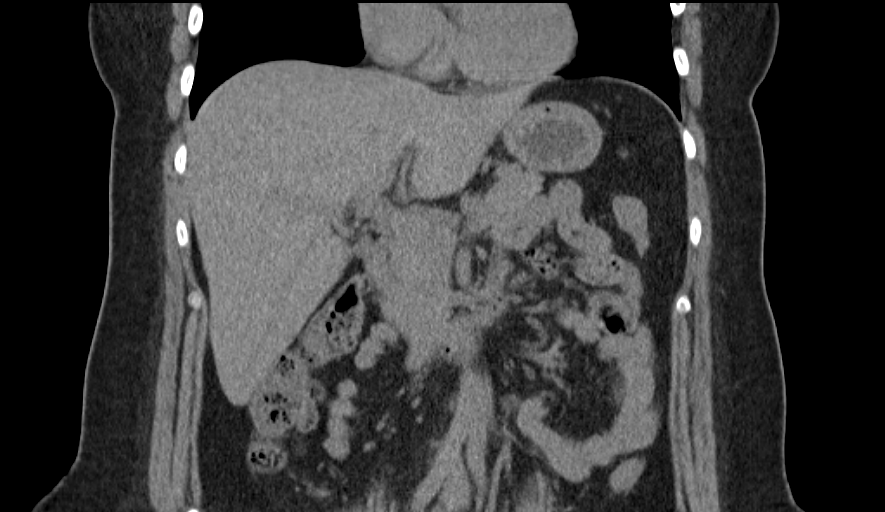
[im 46/82  soft-tissue]
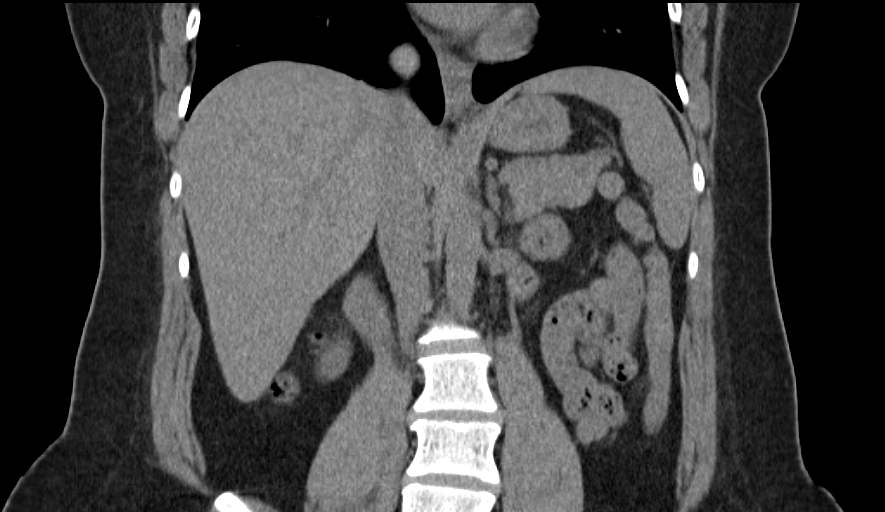

[15 of 46 positions shown; findings below may reference images not displayed]

FINDINGS: Lower chest:  No acute findings.

Hepatobiliary: No mass visualized on this un-enhanced exam.
Gallbladder is unremarkable.

Pancreas: No mass or inflammatory process identified on this
un-enhanced exam.

Spleen: Within normal limits in size.

Adrenals/Urinary Tract: No evidence of urolithiasis or
hydronephrosis. 3.5 cm fluid attenuation cysts noted in left kidney.

Stomach/Bowel: No evidence of obstruction, inflammatory process, or
abnormal fluid collections.

Vascular/Lymphatic: No pathologically enlarged lymph nodes. No
evidence of abdominal aortic aneurysm.

Other: An asymmetric soft tissue mass is seen in the left rectus
abdominus muscle, just to the left and inferior to the umbilicus.
This measures 2.2 x 2.3 cm on image 54/series 2. The lower anterior
abdominal wall is not visualized on this abdomen only exam.

Musculoskeletal:  No suspicious bone lesions identified.
IMPRESSION: 2.3 cm soft tissue mass in left rectus abdominus muscle, just
inferior to the level of the umbilicus. This could represent an
endometrioma, although other soft tissue neoplasm cannot be excluded
by imaging. Surgical removal should be considered.

## 2017-06-11 ENCOUNTER — Encounter: Payer: Self-pay | Admitting: Obstetrics & Gynecology

## 2017-07-09 ENCOUNTER — Ambulatory Visit (INDEPENDENT_AMBULATORY_CARE_PROVIDER_SITE_OTHER): Payer: BLUE CROSS/BLUE SHIELD | Admitting: Family Medicine

## 2017-07-09 ENCOUNTER — Encounter: Payer: Self-pay | Admitting: Family Medicine

## 2017-07-09 VITALS — BP 128/84 | HR 80 | Temp 98.6°F | Resp 16 | Ht 69.0 in | Wt 246.1 lb

## 2017-07-09 DIAGNOSIS — Z23 Encounter for immunization: Secondary | ICD-10-CM | POA: Diagnosis not present

## 2017-07-09 DIAGNOSIS — Z6836 Body mass index (BMI) 36.0-36.9, adult: Secondary | ICD-10-CM | POA: Diagnosis not present

## 2017-07-09 DIAGNOSIS — O165 Unspecified maternal hypertension, complicating the puerperium: Secondary | ICD-10-CM | POA: Diagnosis not present

## 2017-07-09 DIAGNOSIS — E6609 Other obesity due to excess calories: Secondary | ICD-10-CM

## 2017-07-09 NOTE — Progress Notes (Signed)
    Chief Complaint  Patient presents with  . Hypertension   Healthy  On no Rx meds History reviewed Had hypertension during and after pregnancy.  Both parents with high blood pressure.  Is obese.  We discussed that she needs to work on losing weight.  Heart healthy diet and exercise. Shots up to date Flu shot today   Patient Active Problem List   Diagnosis Date Noted  . History of gestational hypertension 05/15/2017  . Postpartum hypertension 05/15/2017  . Mastitis 05/15/2017  . History of 2 cesarean sections 02/18/2017  . Obesity 10/16/2014  . Abnormal Pap smear of cervix, ASCUS-09/2002 10/16/2014    Outpatient Encounter Prescriptions as of 07/09/2017  Medication Sig  . FENUGREEK PO Take by mouth daily as needed.  . Prenatal Vit-Fe Fumarate-FA (MULTIVITAMIN-PRENATAL) 27-0.8 MG TABS tablet Take 1 tablet by mouth daily at 12 noon.   No facility-administered encounter medications on file as of 07/09/2017.     Allergies  Allergen Reactions  . Penicillins Rash    Has patient had a PCN reaction causing immediate rash, facial/tongue/throat swelling, SOB or lightheadedness with hypotension: No Has patient had a PCN reaction causing severe rash involving mucus membranes or skin necrosis: No Has patient had a PCN reaction that required hospitalization No Has patient had a PCN reaction occurring within the last 10 years: No If all of the above answers are "NO", then may proceed with Cephalosporin use.     Review of Systems  Constitutional: Positive for unexpected weight change. Negative for activity change and appetite change.       Gain  HENT: Negative for congestion, dental problem, postnasal drip and rhinorrhea.   Eyes: Negative for redness and visual disturbance.  Respiratory: Negative for cough and shortness of breath.   Cardiovascular: Negative for chest pain, palpitations and leg swelling.  Gastrointestinal: Negative for abdominal pain, constipation and diarrhea.    Genitourinary: Negative for difficulty urinating, frequency and menstrual problem.  Musculoskeletal: Negative for arthralgias and back pain.  Neurological: Negative for dizziness and headaches.  Psychiatric/Behavioral: Negative for dysphoric mood and sleep disturbance. The patient is not nervous/anxious.     BP 128/84   Pulse 80   Temp 98.6 F (37 C) (Temporal)   Resp 16   Ht 5\' 9"  (1.753 m)   Wt 246 lb 1.9 oz (111.6 kg)   SpO2 98%   BMI 36.35 kg/m   Physical Exam  Constitutional: She is oriented to person, place, and time. She appears well-developed and well-nourished.  HENT:  Head: Normocephalic and atraumatic.  Mouth/Throat: Oropharynx is clear and moist.  Eyes: Pupils are equal, round, and reactive to light. Conjunctivae are normal.  Neck: Normal range of motion. Neck supple.  Cardiovascular: Normal rate, regular rhythm and normal heart sounds.   Pulmonary/Chest: Effort normal and breath sounds normal. No respiratory distress.  Musculoskeletal: Normal range of motion. She exhibits no edema.  Neurological: She is alert and oriented to person, place, and time.  Gait normal  Skin: Skin is warm and dry.  Psychiatric: She has a normal mood and affect. Her behavior is normal. Thought content normal.  Nursing note and vitals reviewed.   ASSESSMENT/PLAN:  1. Need for influenza vaccination  - Flu Vaccine QUAD 36+ mos IM  2. Postpartum hypertension Resolved 3. Obesity   Patient Instructions  Call for problems See me yearly   Eustace MooreYvonne Sue Nelson, MD

## 2017-07-09 NOTE — Patient Instructions (Signed)
Call for problems See me yearly

## 2017-11-27 ENCOUNTER — Ambulatory Visit (INDEPENDENT_AMBULATORY_CARE_PROVIDER_SITE_OTHER): Payer: BLUE CROSS/BLUE SHIELD | Admitting: Obstetrics & Gynecology

## 2017-11-27 ENCOUNTER — Encounter: Payer: Self-pay | Admitting: Obstetrics & Gynecology

## 2017-11-27 ENCOUNTER — Other Ambulatory Visit (HOSPITAL_COMMUNITY)
Admission: RE | Admit: 2017-11-27 | Discharge: 2017-11-27 | Disposition: A | Discharge status: Home / Self Care | Payer: BLUE CROSS/BLUE SHIELD | Source: Ambulatory Visit | Attending: Obstetrics & Gynecology | Admitting: Obstetrics & Gynecology

## 2017-11-27 VITALS — BP 120/80 | HR 90 | Ht 69.0 in | Wt 262.0 lb

## 2017-11-27 DIAGNOSIS — Z8349 Family history of other endocrine, nutritional and metabolic diseases: Secondary | ICD-10-CM | POA: Insufficient documentation

## 2017-11-27 DIAGNOSIS — Z1151 Encounter for screening for human papillomavirus (HPV): Secondary | ICD-10-CM | POA: Diagnosis not present

## 2017-11-27 DIAGNOSIS — Z01419 Encounter for gynecological examination (general) (routine) without abnormal findings: Secondary | ICD-10-CM

## 2017-11-27 DIAGNOSIS — I10 Essential (primary) hypertension: Secondary | ICD-10-CM | POA: Insufficient documentation

## 2017-11-27 DIAGNOSIS — Z8249 Family history of ischemic heart disease and other diseases of the circulatory system: Secondary | ICD-10-CM | POA: Diagnosis not present

## 2017-11-27 NOTE — Progress Notes (Signed)
Subjective:     Sonya Chapman is a 37 y.o. female here for a routine exam.  Patient's last menstrual period was 11/20/2017. [id number] Birth Control Method:  Breast feeding micronor if desired Menstrual Calendar(currently): 2 periods since delivery  Current complaints: carpal tunnel right hand.   Current acute medical issues:  none   Recent Gynecologic History Patient's last menstrual period was 11/20/2017. Last Pap: 2017,  normal Last mammogram: na,    Past Medical History:  Diagnosis Date  . Allergy    environmental  . Cellulitis   . Endometrioma   . Hypertension     Past Surgical History:  Procedure Laterality Date  . CESAREAN SECTION    . CESAREAN SECTION N/A 04/03/2017   Procedure: REPEAT CESAREAN SECTION;  Surgeon: Lazaro ArmsEure, Luther H, MD;  [facility];  Service: Obstetrics;  Laterality: N/A;  . EXCISION OF ENDOMETRIOMA  12/26/2015   Procedure: RESECTION OF PERIUMBILICAL ENDOMETRIOMA;  Surgeon: Lazaro ArmsLuther H Eure, MD;  Location: [facility];  Service: Gynecology;;  . Leone HavenWISDOM TOOTH EXTRACTION      OB History    Gravida Para Term Preterm AB Living   2 2 2     2    SAB TAB Ectopic Multiple Live Births         0 2      Obstetric Comments   IVF pregnancy,incision abscess      Social History   Socioeconomic History  . Marital status: Married    Spouse name: Fraser Dinreston  . Number of children: 2  . Years of education: 2716  . Highest education level: None  Social Needs  . Financial resource strain: None  . Food insecurity - worry: None  . Food insecurity - inability: None  . Transportation needs - medical: None  . Transportation needs - non-medical: None  Occupational History  . Occupation: Chartered certified accountantbank loan officer  Tobacco Use  . Smoking status: Never Smoker  . Smokeless tobacco: Never Used  Substance and Sexual Activity  . Alcohol use: No    Alcohol/week: 0.0 oz    Comment: occassional  . Drug use: No  . Sexual activity: Yes    Birth control/protection: None  Other  Topics Concern  . None  Social History Narrative   Bachelors degree   Husband Fraser Dinreston   Two daughters    Family History  Problem Relation Age of Onset  . Hypertension Mother   . Thyroid disease Mother   . Hypertension Father   . Rheum arthritis Maternal Grandfather   . Stroke Paternal Grandmother   . Alcohol abuse Paternal Grandfather   . Rheum arthritis Cousin      Current Outpatient Medications:  .  Prenatal Vit-Fe Fumarate-FA (MULTIVITAMIN-PRENATAL) 27-0.8 MG TABS tablet, Take 1 tablet by mouth daily at 12 noon., Disp: , Rfl:  .  FENUGREEK PO, Take by mouth daily as needed., Disp: , Rfl:   Review of Systems  Review of Systems  Constitutional: Negative for fever, chills, weight loss, malaise/fatigue and diaphoresis.  HENT: Negative for hearing loss, ear pain, nosebleeds, congestion, sore throat, neck pain, tinnitus and ear discharge.   Eyes: Negative for blurred vision, double vision, photophobia, pain, discharge and redness.  Respiratory: Negative for cough, hemoptysis, sputum production, shortness of breath, wheezing and stridor.   Cardiovascular: Negative for chest pain, palpitations, orthopnea, claudication, leg swelling and PND.  Gastrointestinal: negative for abdominal pain. Negative for heartburn, nausea, vomiting, diarrhea, constipation, blood in stool and melena.  Genitourinary: Negative for dysuria, urgency, frequency, hematuria  and flank pain.  Musculoskeletal: Negative for myalgias, back pain, joint pain and falls.  Skin: Negative for itching and rash.  Neurological: Negative for dizziness, tingling, tremors, sensory change, speech change, focal weakness, seizures, loss of consciousness, weakness and headaches.  Endo/Heme/Allergies: Negative for environmental allergies and polydipsia. Does not bruise/bleed easily.  Psychiatric/Behavioral: Negative for depression, suicidal ideas, hallucinations, memory loss and substance abuse. The patient is not nervous/anxious and  does not have insomnia.        Objective:  Blood pressure 120/80, pulse 90, height 5\' 9"  (1.753 m), weight 262 lb (118.8 kg), last menstrual period 11/20/2017, currently breastfeeding.   Physical Exam  Vitals reviewed. Constitutional: She is oriented to person, place, and time. She appears well-developed and well-nourished.  HENT:  Head: Normocephalic and atraumatic.        Right Ear: External ear normal.  Left Ear: External ear normal.  Nose: Nose normal.  Mouth/Throat: Oropharynx is clear and moist.  Eyes: Conjunctivae and EOM are normal. Pupils are equal, round, and reactive to light. Right eye exhibits no discharge. Left eye exhibits no discharge. No scleral icterus.  Neck: Normal range of motion. Neck supple. No tracheal deviation present. No thyromegaly present.  Cardiovascular: Normal rate, regular rhythm, normal heart sounds and intact distal pulses.  Exam reveals no gallop and no friction rub.   No murmur heard. Respiratory: Effort normal and breath sounds normal. No respiratory distress. She has no wheezes. She has no rales. She exhibits no tenderness.  GI: Soft. Bowel sounds are normal. She exhibits no distension and no mass. There is no tenderness. There is no rebound and no guarding. The peri umbilical area of her surgery is bening  Genitourinary:  Breasts no masses skin changes or nipple changes bilaterally      Vulva is normal without lesions Vagina is pink moist without discharge Cervix normal in appearance and pap is done Uterus is normal size shape and contour Adnexa is negative with normal sized ovaries   Musculoskeletal: Normal range of motion. She exhibits no edema and no tenderness.  Neurological: She is alert and oriented to person, place, and time. She has normal reflexes. She displays normal reflexes. No cranial nerve deficit. She exhibits normal muscle tone. Coordination normal.  Skin: Skin is warm and dry. No rash noted. No erythema. No pallor.  Psychiatric:  She has a normal mood and affect. Her behavior is normal. Judgment and thought content normal.       Medications Ordered at today's visit: No orders of the defined types were placed in this encounter.   Other orders placed at today's visit: No orders of the defined types were placed in this encounter.     Assessment:    Healthy female exam.    Plan:    micronor if desired, has been prescribed in the past, pt will send me a MyChart message  Carpal tunnel symptoms, continue to use at night and prn, if symptoms progress pt will contact me and will arrange follow up with Dr Romeo Apple     Return in about 1 year (around 11/27/2018) for yearly, with Dr Despina Hidden.

## 2017-12-01 LAB — CYTOLOGY - PAP
Diagnosis: NEGATIVE
HPV: NOT DETECTED

## 2017-12-06 DIAGNOSIS — J09X2 Influenza due to identified novel influenza A virus with other respiratory manifestations: Secondary | ICD-10-CM | POA: Diagnosis not present

## 2018-04-09 ENCOUNTER — Encounter: Payer: Self-pay | Admitting: Family Medicine

## 2018-06-21 DIAGNOSIS — M205X1 Other deformities of toe(s) (acquired), right foot: Secondary | ICD-10-CM | POA: Diagnosis not present

## 2018-06-21 DIAGNOSIS — M722 Plantar fascial fibromatosis: Secondary | ICD-10-CM | POA: Diagnosis not present

## 2018-06-21 DIAGNOSIS — M79671 Pain in right foot: Secondary | ICD-10-CM | POA: Diagnosis not present

## 2018-06-21 DIAGNOSIS — M19071 Primary osteoarthritis, right ankle and foot: Secondary | ICD-10-CM | POA: Diagnosis not present

## 2018-07-26 DIAGNOSIS — M79671 Pain in right foot: Secondary | ICD-10-CM | POA: Diagnosis not present

## 2018-07-26 DIAGNOSIS — M79672 Pain in left foot: Secondary | ICD-10-CM | POA: Diagnosis not present

## 2018-07-26 DIAGNOSIS — M722 Plantar fascial fibromatosis: Secondary | ICD-10-CM | POA: Diagnosis not present

## 2018-08-12 DIAGNOSIS — R5383 Other fatigue: Secondary | ICD-10-CM | POA: Diagnosis not present

## 2018-08-12 DIAGNOSIS — E669 Obesity, unspecified: Secondary | ICD-10-CM | POA: Diagnosis not present

## 2018-08-12 DIAGNOSIS — Z13220 Encounter for screening for lipoid disorders: Secondary | ICD-10-CM | POA: Diagnosis not present

## 2018-08-12 DIAGNOSIS — Z0000 Encounter for general adult medical examination without abnormal findings: Secondary | ICD-10-CM | POA: Diagnosis not present

## 2018-08-12 DIAGNOSIS — Z23 Encounter for immunization: Secondary | ICD-10-CM | POA: Diagnosis not present

## 2018-08-12 DIAGNOSIS — E559 Vitamin D deficiency, unspecified: Secondary | ICD-10-CM | POA: Diagnosis not present

## 2018-08-12 DIAGNOSIS — Z131 Encounter for screening for diabetes mellitus: Secondary | ICD-10-CM | POA: Diagnosis not present

## 2018-08-12 DIAGNOSIS — Z0389 Encounter for observation for other suspected diseases and conditions ruled out: Secondary | ICD-10-CM | POA: Diagnosis not present

## 2018-08-30 DIAGNOSIS — H6502 Acute serous otitis media, left ear: Secondary | ICD-10-CM | POA: Diagnosis not present

## 2018-09-01 DIAGNOSIS — T162XXA Foreign body in left ear, initial encounter: Secondary | ICD-10-CM | POA: Diagnosis not present

## 2018-09-01 DIAGNOSIS — H109 Unspecified conjunctivitis: Secondary | ICD-10-CM | POA: Diagnosis not present

## 2018-09-20 DIAGNOSIS — M79671 Pain in right foot: Secondary | ICD-10-CM | POA: Diagnosis not present

## 2018-09-20 DIAGNOSIS — M205X1 Other deformities of toe(s) (acquired), right foot: Secondary | ICD-10-CM | POA: Diagnosis not present

## 2018-09-20 DIAGNOSIS — M19071 Primary osteoarthritis, right ankle and foot: Secondary | ICD-10-CM | POA: Diagnosis not present

## 2018-09-28 DIAGNOSIS — E559 Vitamin D deficiency, unspecified: Secondary | ICD-10-CM | POA: Diagnosis not present

## 2018-09-28 DIAGNOSIS — R030 Elevated blood-pressure reading, without diagnosis of hypertension: Secondary | ICD-10-CM | POA: Diagnosis not present

## 2018-09-28 DIAGNOSIS — E669 Obesity, unspecified: Secondary | ICD-10-CM | POA: Diagnosis not present

## 2018-11-01 DIAGNOSIS — M79671 Pain in right foot: Secondary | ICD-10-CM | POA: Diagnosis not present

## 2018-11-01 DIAGNOSIS — M205X1 Other deformities of toe(s) (acquired), right foot: Secondary | ICD-10-CM | POA: Diagnosis not present

## 2018-11-30 ENCOUNTER — Encounter: Payer: Self-pay | Admitting: Obstetrics & Gynecology

## 2018-11-30 ENCOUNTER — Ambulatory Visit (INDEPENDENT_AMBULATORY_CARE_PROVIDER_SITE_OTHER): Payer: BLUE CROSS/BLUE SHIELD | Admitting: Obstetrics & Gynecology

## 2018-11-30 VITALS — BP 131/80 | HR 84 | Ht 68.5 in | Wt 257.0 lb

## 2018-11-30 DIAGNOSIS — Z01419 Encounter for gynecological examination (general) (routine) without abnormal findings: Secondary | ICD-10-CM

## 2018-11-30 MED ORDER — DESOGESTREL-ETHINYL ESTRADIOL 0.15-0.02/0.01 MG (21/5) PO TABS: 1.0000 | ORAL_TABLET | Freq: Every day | ORAL | 11 refills | Status: DC

## 2018-11-30 NOTE — Progress Notes (Signed)
Subjective:     Sonya Chapman is a 38 y.o. female here for a routine exam.  Patient's last menstrual period was 11/11/2018. [id number] Birth Control Method:  none Menstrual Calendar(currently): regular  Current complaints: menses getting a bit heavier.   Current acute medical issues:     Recent Gynecologic History Patient's last menstrual period was 11/11/2018. Last Pap: 2019,  normal Last mammogram: ,    Past Medical History:  Diagnosis Date  . Allergy    environmental  . Cellulitis   . Endometrioma   . Hypertension     Past Surgical History:  Procedure Laterality Date  . CESAREAN SECTION    . CESAREAN SECTION N/A 04/03/2017   Procedure: REPEAT CESAREAN SECTION;  Surgeon: Lazaro Arms,  H, MD;  [facility];  Service: Obstetrics;  Laterality: N/A;  . EXCISION OF ENDOMETRIOMA  12/26/2015   Procedure: RESECTION OF PERIUMBILICAL ENDOMETRIOMA;  Surgeon: Lazaro Arms H , MD;  Location: [facility];  Service: Gynecology;;  . Leone HavenWISDOM TOOTH EXTRACTION      OB History    Gravida  2   Para  2   Term  2   Preterm      AB      Living  2     SAB      TAB      Ectopic      Multiple  0   Live Births  2        Obstetric Comments  IVF pregnancy,incision abscess        Social History   Socioeconomic History  . Marital status: Married    Spouse name: Fraser Dinreston  . Number of children: 2  . Years of education: 7316  . Highest education level: Not on file  Occupational History  . Occupation: Chartered certified accountantbank loan officer  Social Needs  . Financial resource strain: Not on file  . Food insecurity:    Worry: Not on file    Inability: Not on file  . Transportation needs:    Medical: Not on file    Non-medical: Not on file  Tobacco Use  . Smoking status: Never Smoker  . Smokeless tobacco: Never Used  Substance and Sexual Activity  . Alcohol use: Yes    Alcohol/week: 0.0 standard drinks    Comment: occassional  . Drug use: No  . Sexual activity: Yes    Birth  control/protection: None  Lifestyle  . Physical activity:    Days per week: Not on file    Minutes per session: Not on file  . Stress: Not on file  Relationships  . Social connections:    Talks on phone: Not on file    Gets together: Not on file    Attends religious service: Not on file    Active member of club or organization: Not on file    Attends meetings of clubs or organizations: Not on file    Relationship status: Not on file  Other Topics Concern  . Not on file  Social History Narrative   Bachelors degree   Husband Fraser Dinreston   Two daughters    Family History  Problem Relation Age of Onset  . Hypertension Mother   . Thyroid disease Mother   . Hypertension Father   . Rheum arthritis Maternal Grandfather   . Stroke Paternal Grandmother   . Alcohol abuse Paternal Grandfather   . Rheum arthritis Cousin      Current Outpatient Medications:  .  Cholecalciferol (D-3-5) 125 MCG (5000 UT) capsule,  daily. , Disp: , Rfl:  .  Multiple Vitamins-Minerals (MULTIPLE VITAMINS/WOMENS PO), daily. , Disp: , Rfl:  .  desogestrel-ethinyl estradiol (KARIVA,AZURETTE,MIRCETTE) 0.15-0.02/0.01 MG (21/5) tablet, Take 1 tablet by mouth daily. Take continuously and have off week every 3 months, so refill every 3 weeks, Disp: 1 Package, Rfl: 11  Review of Systems  Review of Systems  Constitutional: Negative for fever, chills, weight loss, malaise/fatigue and diaphoresis.  HENT: Negative for hearing loss, ear pain, nosebleeds, congestion, sore throat, neck pain, tinnitus and ear discharge.   Eyes: Negative for blurred vision, double vision, photophobia, pain, discharge and redness.  Respiratory: Negative for cough, hemoptysis, sputum production, shortness of breath, wheezing and stridor.   Cardiovascular: Negative for chest pain, palpitations, orthopnea, claudication, leg swelling and PND.  Gastrointestinal: negative for abdominal pain. Negative for heartburn, nausea, vomiting, diarrhea,  constipation, blood in stool and melena.  Genitourinary: Negative for dysuria, urgency, frequency, hematuria and flank pain.  Musculoskeletal: Negative for myalgias, back pain, joint pain and falls.  Skin: Negative for itching and rash.  Neurological: Negative for dizziness, tingling, tremors, sensory change, speech change, focal weakness, seizures, loss of consciousness, weakness and headaches.  Endo/Heme/Allergies: Negative for environmental allergies and polydipsia. Does not bruise/bleed easily.  Psychiatric/Behavioral: Negative for depression, suicidal ideas, hallucinations, memory loss and substance abuse. The patient is not nervous/anxious and does not have insomnia.        Objective:  Blood pressure 131/80, pulse 84, height 5' 8.5" (1.74 m), weight 257 lb (116.6 kg), last menstrual period 11/11/2018, not currently breastfeeding.   Physical Exam  Vitals reviewed. Constitutional: She is oriented to person, place, and time. She appears well-developed and well-nourished.  HENT:  Head: Normocephalic and atraumatic.        Right Ear: External ear normal.  Left Ear: External ear normal.  Nose: Nose normal.  Mouth/Throat: Oropharynx is clear and moist.  Eyes: Conjunctivae and EOM are normal. Pupils are equal, round, and reactive to light. Right eye exhibits no discharge. Left eye exhibits no discharge. No scleral icterus.  Neck: Normal range of motion. Neck supple. No tracheal deviation present. No thyromegaly present.  Cardiovascular: Normal rate, regular rhythm, normal heart sounds and intact distal pulses.  Exam reveals no gallop and no friction rub.   No murmur heard. Respiratory: Effort normal and breath sounds normal. No respiratory distress. She has no wheezes. She has no rales. She exhibits no tenderness.  GI: Soft. Bowel sounds are normal. She exhibits no distension and no mass. There is no tenderness. There is no rebound and no guarding.  Genitourinary:  Breasts no masses skin  changes or nipple changes bilaterally      Vulva is normal without lesions Vagina is pink moist without discharge Cervix normal in appearance and pap is done Uterus is normal size shape and contour Adnexa is negative with normal sized ovaries   Musculoskeletal: Normal range of motion. She exhibits no edema and no tenderness.  Neurological: She is alert and oriented to person, place, and time. She has normal reflexes. She displays normal reflexes. No cranial nerve deficit. She exhibits normal muscle tone. Coordination normal.  Skin: Skin is warm and dry. No rash noted. No erythema. No pallor.  Psychiatric: She has a normal mood and affect. Her behavior is normal. Judgment and thought content normal.       Medications Ordered at today's visit: Meds ordered this encounter  Medications  . desogestrel-ethinyl estradiol (KARIVA,AZURETTE,MIRCETTE) 0.15-0.02/0.01 MG (21/5) tablet    Sig: Take 1  tablet by mouth daily. Take continuously and have off week every 3 months, so refill every 3 weeks    Dispense:  1 Package    Refill:  11    Other orders placed at today's visit: No orders of the defined types were placed in this encounter.     Assessment:    Healthy female exam.    Plan:    Follow up in: 1 year. begin continuous OCP with q 3 month menses to better manage cycles     Return in about 1 year (around 12/01/2019) for yearly.

## 2018-12-13 DIAGNOSIS — M205X1 Other deformities of toe(s) (acquired), right foot: Secondary | ICD-10-CM | POA: Diagnosis not present

## 2018-12-13 DIAGNOSIS — M79671 Pain in right foot: Secondary | ICD-10-CM | POA: Diagnosis not present

## 2018-12-29 DIAGNOSIS — E559 Vitamin D deficiency, unspecified: Secondary | ICD-10-CM | POA: Diagnosis not present

## 2018-12-29 DIAGNOSIS — Z713 Dietary counseling and surveillance: Secondary | ICD-10-CM | POA: Diagnosis not present

## 2018-12-29 DIAGNOSIS — E669 Obesity, unspecified: Secondary | ICD-10-CM | POA: Diagnosis not present

## 2018-12-29 DIAGNOSIS — R030 Elevated blood-pressure reading, without diagnosis of hypertension: Secondary | ICD-10-CM | POA: Diagnosis not present

## 2019-03-02 ENCOUNTER — Other Ambulatory Visit: Payer: Self-pay | Admitting: Obstetrics & Gynecology

## 2019-03-02 MED ORDER — KETOROLAC TROMETHAMINE 10 MG PO TABS: 10.0000 mg | ORAL_TABLET | Freq: Three times a day (TID) | ORAL | 0 refills | Status: DC | PRN

## 2019-04-28 ENCOUNTER — Ambulatory Visit (INDEPENDENT_AMBULATORY_CARE_PROVIDER_SITE_OTHER): Payer: BC Managed Care – PPO | Admitting: Internal Medicine

## 2019-04-28 ENCOUNTER — Encounter (INDEPENDENT_AMBULATORY_CARE_PROVIDER_SITE_OTHER): Payer: Self-pay

## 2019-04-28 DIAGNOSIS — J019 Acute sinusitis, unspecified: Secondary | ICD-10-CM | POA: Diagnosis not present

## 2019-04-28 DIAGNOSIS — E559 Vitamin D deficiency, unspecified: Secondary | ICD-10-CM | POA: Diagnosis not present

## 2019-04-28 DIAGNOSIS — R030 Elevated blood-pressure reading, without diagnosis of hypertension: Secondary | ICD-10-CM | POA: Diagnosis not present

## 2019-04-28 DIAGNOSIS — E669 Obesity, unspecified: Secondary | ICD-10-CM | POA: Diagnosis not present

## 2019-07-04 DIAGNOSIS — M205X1 Other deformities of toe(s) (acquired), right foot: Secondary | ICD-10-CM | POA: Diagnosis not present

## 2019-07-04 DIAGNOSIS — M79671 Pain in right foot: Secondary | ICD-10-CM | POA: Diagnosis not present

## 2019-07-12 ENCOUNTER — Other Ambulatory Visit: Payer: Self-pay | Admitting: Obstetrics & Gynecology

## 2019-08-25 ENCOUNTER — Encounter (INDEPENDENT_AMBULATORY_CARE_PROVIDER_SITE_OTHER): Payer: Self-pay | Admitting: Internal Medicine

## 2019-08-25 ENCOUNTER — Other Ambulatory Visit: Payer: Self-pay

## 2019-08-25 ENCOUNTER — Ambulatory Visit (INDEPENDENT_AMBULATORY_CARE_PROVIDER_SITE_OTHER): Payer: BC Managed Care – PPO | Admitting: Internal Medicine

## 2019-08-25 VITALS — BP 128/88 | HR 81 | Ht 69.0 in | Wt 234.2 lb

## 2019-08-25 DIAGNOSIS — Z6836 Body mass index (BMI) 36.0-36.9, adult: Secondary | ICD-10-CM | POA: Diagnosis not present

## 2019-08-25 DIAGNOSIS — Z0001 Encounter for general adult medical examination with abnormal findings: Secondary | ICD-10-CM | POA: Diagnosis not present

## 2019-08-25 DIAGNOSIS — E559 Vitamin D deficiency, unspecified: Secondary | ICD-10-CM | POA: Diagnosis not present

## 2019-08-25 DIAGNOSIS — E6609 Other obesity due to excess calories: Secondary | ICD-10-CM

## 2019-08-25 DIAGNOSIS — R5383 Other fatigue: Secondary | ICD-10-CM | POA: Diagnosis not present

## 2019-08-25 DIAGNOSIS — Z1231 Encounter for screening mammogram for malignant neoplasm of breast: Secondary | ICD-10-CM | POA: Diagnosis not present

## 2019-08-25 NOTE — Patient Instructions (Signed)
[email or web address]

## 2019-08-25 NOTE — Progress Notes (Signed)
Chief Complaint: This 38 year old lady comes in for an annual physical exam. HPI: She is doing reasonably well and has managed to lose further weight.  She continues fasting every day, 15 to 16 hours.  She tries to eat healthy in between.  She has managed to lose around 35 pounds in the last 1 year or so. She also takes vitamin D3 supplementation for vitamin D deficiency. She has no major complaints today.  Past Medical History:  Diagnosis Date  . Allergy    environmental  . Cellulitis   . Endometrioma   . Hypertension    Past Surgical History:  Procedure Laterality Date  . CESAREAN SECTION    . CESAREAN SECTION N/A 04/03/2017   Procedure: REPEAT CESAREAN SECTION;  Surgeon: Lazaro Arms, MD;  Location: [facility];  Service: Obstetrics;  Laterality: N/A;  . EXCISION OF ENDOMETRIOMA  12/26/2015   Procedure: RESECTION OF PERIUMBILICAL ENDOMETRIOMA;  Surgeon: Lazaro Arms, MD;  Location: [facility];  Service: Gynecology;;  . [name] TOOTH EXTRACTION       Social History   Social History Narrative   Bachelors degree   Husband Preston,married 15 years.   Two daughters   Personal assistant.        Allergies:  Allergies  Allergen Reactions  . Penicillins Rash    Has patient had a PCN reaction causing immediate rash, facial/tongue/throat swelling, SOB or lightheadedness with hypotension: No Has patient had a PCN reaction causing severe rash involving mucus membranes or skin necrosis: No Has patient had a PCN reaction that required hospitalization No Has patient had a PCN reaction occurring within the last 10 years: No If all of the above answers are "NO", then may proceed with Cephalosporin use.      Current Meds  Medication Sig  . Ascorbic Acid (VITAMIN C) 1000 MG tablet Take 1,000 mg by mouth daily.  . Cholecalciferol (D-3-5) 125 MCG (5000 UT) capsule 10,000 Units daily.   . Multiple Vitamins-Minerals (MULTIPLE VITAMINS/WOMENS PO) daily.   Marland Kitchen VIORELE  0.15-0.02/0.01 MG (21/5) tablet TAKE 1 TABLET BY MOUTH EVERY DAY(TAKE ACTIVE PILLS CONTINUOUSLY., ONLY SKIP 1 WEEK EVERY 3 MONTHS)     Nutrition Intermittent fasting, 15 to 16 hours every day.  Sleep Adequate.  Exercise None regular.  Bio-identical hormones None.  ION:GEXBM from the symptoms mentioned above,there are no other symptoms referable to all systems reviewed.  Physical Exam: Blood pressure 128/88, pulse 81, height 5\' 9"  (1.753 m), weight 234 lb 3.2 oz (106.2 kg), SpO2 99 %. Vitals with BMI 08/25/2019 11/30/2018 11/27/2017  Height 5\' 9"  5' 8.5" 5\' 9"   Weight 234 lbs 3 oz 257 lbs 262 lbs  BMI 34.57 38.5 38.67  Systolic 128 131 11/29/2017  Diastolic 88 80 80  Pulse 81 84 90      She looks systemically well, remains obese but has lost significant weight over the last 1 year. General: Alert, cooperative, and appears to be the stated age.No pallor.  No jaundice.  No clubbing. Head: Normocephalic Eyes: Sclera white, pupils equal and reactive to light, red reflex x 2,  Ears: Normal bilaterally Oral cavity: Lips, mucosa, and tongue normal: Teeth and gums normal Neck: No adenopathy, supple, symmetrical, trachea midline, and thyroid does not appear enlarged. Breast: No masses felt. Respiratory: Clear to auscultation bilaterally.No wheezing, crackles or bronchial breathing. Cardiovascular: Heart sounds are present and appear to be normal without murmurs or added sounds.  No carotid bruits.  Peripheral pulses are present and  equal bilaterally.: Gastrointestinal:positive bowel sounds, no hepatosplenomegaly.  No masses felt.No tenderness. Skin: Clear, No rashes noted.No worrisome skin lesions seen. Neurological: Grossly intact without focal findings, cranial nerves II through XII intact, muscle strength equal bilaterally Musculoskeletal: No acute joint abnormalities noted.Full range of movement noted with joints. Psychiatric: Affect appropriate, non-anxious.    Assessment  1.  Encounter for screening mammogram for malignant neoplasm of breast   2. Encounter for general adult medical examination with abnormal findings   3. Class 2 obesity due to excess calories without serious comorbidity with body mass index (BMI) of 36.0 to 36.9 in adult   4. Vitamin D deficiency disease     Tests Ordered:   Orders Placed This Encounter  Procedures  . MM 3D SCREEN BREAST BILATERAL  . CBC  . COMPLETE METABOLIC PANEL WITH GFR  . Hemoglobin A1c  . Lipid panel  . T3, free  . T4  . TSH  . VITAMIN D 25 Hydroxy (Vit-D Deficiency, Fractures)     Plan  1. She will continue to try to lose further weight with a combination of diet and exercise which have encouraged now as she is not exercising on a regular basis. 2. We will set her up for screening mammogram. 3. Further recommendations will depend on blood results above and she will follow-up with Sarah in about 3 to 4 months time.     No orders of the defined types were placed in this encounter.    Sonya Chapman C Irelynn Schermerhorn   08/25/2019, 11:31 AM

## 2019-08-26 LAB — COMPLETE METABOLIC PANEL WITH GFR
AG Ratio: 1.6 (calc) (ref 1.0–2.5)
ALT: 6 U/L (ref 6–29)
AST: 14 U/L (ref 10–30)
Albumin: 4.2 g/dL (ref 3.6–5.1)
Alkaline phosphatase (APISO): 54 U/L (ref 31–125)
BUN: 11 mg/dL (ref 7–25)
CO2: 23 mmol/L (ref 20–32)
Calcium: 9.4 mg/dL (ref 8.6–10.2)
Chloride: 102 mmol/L (ref 98–110)
Creat: 0.92 mg/dL (ref 0.50–1.10)
GFR, Est African American: 90 mL/min/{1.73_m2} (ref >=60)
GFR, Est Non African American: 78 mL/min/{1.73_m2} (ref >=60)
Globulin: 2.7 g/dL (calc) (ref 1.9–3.7)
Glucose, Bld: 84 mg/dL (ref 65–99)
Potassium: 4.5 mmol/L (ref 3.5–5.3)
Sodium: 138 mmol/L (ref 135–146)
Total Bilirubin: 0.4 mg/dL (ref 0.2–1.2)
Total Protein: 6.9 g/dL (ref 6.1–8.1)

## 2019-08-26 LAB — CBC
HCT: 42.3 % (ref 35.0–45.0)
Hemoglobin: 13.9 g/dL (ref 11.7–15.5)
MCH: 28.9 pg (ref 27.0–33.0)
MCHC: 32.9 g/dL (ref 32.0–36.0)
MCV: 87.9 fL (ref 80.0–100.0)
MPV: 11.8 fL (ref 7.5–12.5)
Platelets: 221 10*3/uL (ref 140–400)
RBC: 4.81 10*6/uL (ref 3.80–5.10)
RDW: 12.7 % (ref 11.0–15.0)
WBC: 7.2 10*3/uL (ref 3.8–10.8)

## 2019-08-26 LAB — HEMOGLOBIN A1C
Hgb A1c MFr Bld: 5.1 % of total Hgb (ref <5.7)
Mean Plasma Glucose: 100 (calc)
eAG (mmol/L): 5.5 (calc)

## 2019-08-26 LAB — LIPID PANEL
Cholesterol: 201 mg/dL — ABNORMAL HIGH (ref <200)
HDL: 60 mg/dL (ref >=50)
LDL Cholesterol (Calc): 115 mg/dL (calc) — ABNORMAL HIGH
Non-HDL Cholesterol (Calc): 141 mg/dL (calc) — ABNORMAL HIGH (ref <130)
Total CHOL/HDL Ratio: 3.4 (calc) (ref <5.0)
Triglycerides: 144 mg/dL (ref <150)

## 2019-08-26 LAB — T4: T4, Total: 16.9 ug/dL — ABNORMAL HIGH (ref 5.1–11.9)

## 2019-08-26 LAB — T3, FREE: T3, Free: 3.1 pg/mL (ref 2.3–4.2)

## 2019-08-26 LAB — TSH: TSH: 1.63 mIU/L

## 2019-08-26 LAB — VITAMIN D 25 HYDROXY (VIT D DEFICIENCY, FRACTURES): Vit D, 25-Hydroxy: 93 ng/mL (ref 30–100)

## 2019-09-08 ENCOUNTER — Ambulatory Visit (HOSPITAL_COMMUNITY)
Admission: RE | Admit: 2019-09-08 | Discharge: 2019-09-08 | Disposition: A | Discharge status: Home / Self Care | Payer: BC Managed Care – PPO | Source: Ambulatory Visit | Attending: Internal Medicine | Admitting: Internal Medicine

## 2019-09-08 ENCOUNTER — Encounter (HOSPITAL_COMMUNITY): Payer: Self-pay

## 2019-09-08 ENCOUNTER — Other Ambulatory Visit: Payer: Self-pay

## 2019-09-08 DIAGNOSIS — Z1231 Encounter for screening mammogram for malignant neoplasm of breast: Secondary | ICD-10-CM

## 2019-11-07 ENCOUNTER — Other Ambulatory Visit: Payer: Self-pay

## 2019-11-07 ENCOUNTER — Ambulatory Visit: Payer: BC Managed Care – PPO | Attending: Internal Medicine

## 2019-11-07 DIAGNOSIS — Z20822 Contact with and (suspected) exposure to covid-19: Secondary | ICD-10-CM

## 2019-11-08 LAB — NOVEL CORONAVIRUS, NAA: SARS-CoV-2, NAA: NOT DETECTED

## 2020-01-02 ENCOUNTER — Encounter (INDEPENDENT_AMBULATORY_CARE_PROVIDER_SITE_OTHER): Payer: Self-pay | Admitting: Nurse Practitioner

## 2020-01-02 ENCOUNTER — Other Ambulatory Visit: Payer: Self-pay

## 2020-01-02 ENCOUNTER — Ambulatory Visit (INDEPENDENT_AMBULATORY_CARE_PROVIDER_SITE_OTHER): Payer: BC Managed Care – PPO | Admitting: Nurse Practitioner

## 2020-01-02 VITALS — BP 121/80 | HR 78 | Temp 98.0°F | Resp 18 | Ht 69.0 in | Wt 231.4 lb

## 2020-01-02 DIAGNOSIS — E785 Hyperlipidemia, unspecified: Secondary | ICD-10-CM | POA: Diagnosis not present

## 2020-01-02 DIAGNOSIS — E6609 Other obesity due to excess calories: Secondary | ICD-10-CM

## 2020-01-02 DIAGNOSIS — K649 Unspecified hemorrhoids: Secondary | ICD-10-CM | POA: Diagnosis not present

## 2020-01-02 DIAGNOSIS — E559 Vitamin D deficiency, unspecified: Secondary | ICD-10-CM | POA: Diagnosis not present

## 2020-01-02 DIAGNOSIS — Z6834 Body mass index (BMI) 34.0-34.9, adult: Secondary | ICD-10-CM

## 2020-01-02 HISTORY — DX: Hyperlipidemia, unspecified: E78.5

## 2020-01-02 NOTE — Progress Notes (Signed)
Subjective:  Patient ID: Sonya Chapman, female    DOB: 29-Oct-1981  Age: 39 y.o. MRN: [id number]  CC:  Chief Complaint  Patient presents with  . Follow-up    Obesity, vitamin D deficiency, hyperlipidemia, hemorrhoids      HPI  This patient presents today for the above.  Obesity: She has been participating in intermittent fasting.  And has lost a about a total of 38 pounds.  She tells me she does continue to fast for approximately 15 to 16 hours/day, but recently has not been doing this as regularly as before.  Vitamin D deficiency: She continues on 10,000 IUs of vitamin D3 daily.  Last blood work showed vitamin D3 serum level of 93.  Hyperlipidemia: She has a history of hyperlipidemia.  ASCVD risk score based on her last lipid panel was approximately 2%.  Last LDL was 115 and this was collected in 08/2019  Hemorrhoids: She also mentions to me today that she has an external hemorrhoid that has been causing pain and irritation.  She tells me that she experiences the pain almost twice a week.  She has been using over-the-counter witch hazel.  She wonders if there is anything else that can be done for relief.   Past Medical History:  Diagnosis Date  . Allergy    environmental  . Cellulitis   . Endometrioma   . Hyperlipidemia 01/02/2020  . Hypertension       Family History  Problem Relation Age of Onset  . Hypertension Mother   . Thyroid disease Mother   . Hypertension Father   . Rheum arthritis Maternal Grandfather   . Stroke Paternal Grandmother   . Alcohol abuse Paternal Grandfather   . Rheum arthritis Cousin     Social History   Social History Narrative   Bachelors degree   Husband Preston,married 15 years.   Two daughters   Personal assistant.   Social History   Tobacco Use  . Smoking status: Never Smoker  . Smokeless tobacco: Never Used  Substance Use Topics  . Alcohol use: Yes    Alcohol/week: 0.0 standard drinks    Comment: occassional      Current Meds  Medication Sig  . Ascorbic Acid (VITAMIN C) 1000 MG tablet Take 1,000 mg by mouth daily.  . Cholecalciferol (D-3-5) 125 MCG (5000 UT) capsule 10,000 Units daily.   . Multiple Vitamins-Minerals (MULTIPLE VITAMINS/WOMENS PO) daily.   Marland Kitchen VIORELE 0.15-0.02/0.01 MG (21/5) tablet TAKE 1 TABLET BY MOUTH EVERY DAY(TAKE ACTIVE PILLS CONTINUOUSLY., ONLY SKIP 1 WEEK EVERY 3 MONTHS)    ROS:  Review of Systems  Constitutional: Negative for fever.  Respiratory: Negative for cough, shortness of breath and wheezing.   Cardiovascular: Negative for chest pain, palpitations and leg swelling.  Gastrointestinal:       (+) rectal pain     Objective:   Today's Vitals: BP 121/80 (BP Location: Right Arm, Patient Position: Sitting, Cuff Size: Normal)   Pulse 78   Temp 98 F (36.7 C) (Temporal)   Resp 18   Ht 5\' 9"  (1.753 m)   Wt 231 lb 6.4 oz (105 kg)   SpO2 98%   BMI 34.17 kg/m  Vitals with BMI 01/02/2020 08/25/2019 11/30/2018  Height 5\' 9"  5\' 9"  5' 8.5"  Weight 231 lbs 6 oz 234 lbs 3 oz 257 lbs  BMI 34.16 34.57 38.5  Systolic 121 128 12/02/2018  Diastolic 80 88 80  Pulse 78 81 84  Physical Exam Vitals reviewed.  Constitutional:      General: She is not in acute distress.    Appearance: Normal appearance.  HENT:     Head: Normocephalic and atraumatic.  Cardiovascular:     Rate and Rhythm: Normal rate and regular rhythm.     Pulses: Normal pulses.     Heart sounds: Normal heart sounds.  Pulmonary:     Effort: Pulmonary effort is normal.     Breath sounds: Normal breath sounds.  Skin:    General: Skin is warm and dry.  Neurological:     General: No focal deficit present.     Mental Status: She is alert and oriented to person, place, and time.  Psychiatric:        Mood and Affect: Mood normal.        Behavior: Behavior normal.        Judgment: Judgment normal.          Assessment   1. Hemorrhoids, unspecified hemorrhoid type   2. Class 1 obesity due to  excess calories without serious comorbidity with body mass index (BMI) of 34.0 to 34.9 in adult   3. Vitamin D deficiency   4. Hyperlipidemia, unspecified hyperlipidemia type       Tests ordered Orders Placed This Encounter  Procedures  . Ambulatory referral to Gastroenterology     Plan: 1.  I encouraged her to get some over-the-counter cream with lidocaine that she can apply as needed.  I told her if this does not improve her pain to let us know.  In addition I will refer her for further evaluation of possible surgical removal of her hemorrhoid as it seems to be causing her discomfort quite regularly. 2.  She is encouraged to continue practicing the intermittent fasting and to follow a healthy diet as much as possible.  She tells me she will continue to do this. 3.  She will continue on her vitamin D supplement.  Consider collecting vitamin D3 serum level at next office visit. 4.  I did discuss her lipid panel today as well as her ASCVD risk score and what this means.  I encouraged her to continue trying to follow a healthy diet by avoiding processed carbohydrates and continue practicing intermittent fasting when she is able to.  No orders of the defined types were placed in this encounter.   Patient to follow-up in 3 months  Ailene Ards, NP

## 2020-01-10 ENCOUNTER — Ambulatory Visit: Payer: BC Managed Care – PPO | Admitting: General Surgery

## 2020-01-31 ENCOUNTER — Other Ambulatory Visit (HOSPITAL_COMMUNITY)
Admission: RE | Admit: 2020-01-31 | Discharge: 2020-01-31 | Disposition: A | Discharge status: Home / Self Care | Payer: BC Managed Care – PPO | Source: Ambulatory Visit | Attending: Obstetrics & Gynecology | Admitting: Obstetrics & Gynecology

## 2020-01-31 ENCOUNTER — Ambulatory Visit (INDEPENDENT_AMBULATORY_CARE_PROVIDER_SITE_OTHER): Payer: BC Managed Care – PPO | Admitting: Obstetrics & Gynecology

## 2020-01-31 ENCOUNTER — Other Ambulatory Visit: Payer: Self-pay

## 2020-01-31 ENCOUNTER — Encounter: Payer: Self-pay | Admitting: Obstetrics & Gynecology

## 2020-01-31 VITALS — Ht 69.0 in | Wt 231.0 lb

## 2020-01-31 DIAGNOSIS — Z98891 History of uterine scar from previous surgery: Secondary | ICD-10-CM

## 2020-01-31 DIAGNOSIS — Z1151 Encounter for screening for human papillomavirus (HPV): Secondary | ICD-10-CM | POA: Diagnosis not present

## 2020-01-31 DIAGNOSIS — Z01419 Encounter for gynecological examination (general) (routine) without abnormal findings: Secondary | ICD-10-CM

## 2020-01-31 DIAGNOSIS — Z793 Long term (current) use of hormonal contraceptives: Secondary | ICD-10-CM | POA: Diagnosis not present

## 2020-01-31 MED ORDER — HYDROCORTISONE (PERIANAL) 2.5 % EX CREA: 1.0000 "application " | TOPICAL_CREAM | Freq: Two times a day (BID) | CUTANEOUS | 2 refills | Status: DC

## 2020-01-31 NOTE — Progress Notes (Signed)
Subjective:     Sonya Chapman is a 39 y.o. female here for a routine exam.  No LMP recorded. (Menstrual status: Oral contraceptives). [id number] Birth Control Method:  OCP Menstrual Calendar(currently): regular  Current complaints: hemorrhoids.   Current acute medical issues:  none   Recent Gynecologic History No LMP recorded. (Menstrual status: Oral contraceptives). Last Pap: 2019,  normal Last mammogram: 2020,  normal  Past Medical History:  Diagnosis Date  . Allergy    environmental  . Cellulitis   . Endometrioma   . Hyperlipidemia 01/02/2020  . Hypertension     Past Surgical History:  Procedure Laterality Date  . CESAREAN SECTION    . CESAREAN SECTION N/A 04/03/2017   Procedure: REPEAT CESAREAN SECTION;  Surgeon: Lazaro Arms, MD;  Location: [facility] BIRTHING SUITES;  Service: Obstetrics;  Laterality: N/A;  . EXCISION OF ENDOMETRIOMA  12/26/2015   Procedure: RESECTION OF PERIUMBILICAL ENDOMETRIOMA;  Surgeon: Lazaro Arms, MD;  Location: [facility] ORS;  Service: Gynecology;;  . [name] TOOTH EXTRACTION      OB History    Gravida  2   Para  2   Term  2   Preterm      AB      Living  2     SAB      TAB      Ectopic      Multiple  0   Live Births  2        Obstetric Comments  IVF pregnancy,incision abscess        Social History   Socioeconomic History  . Marital status: Married    Spouse name: Fraser Din  . Number of children: 2  . Years of education: 92  . Highest education level: Not on file  Occupational History  . Occupation: Chartered certified accountant  Tobacco Use  . Smoking status: Never Smoker  . Smokeless tobacco: Never Used  Substance and Sexual Activity  . Alcohol use: Yes    Alcohol/week: 0.0 standard drinks    Comment: occassional  . Drug use: No  . Sexual activity: Yes    Birth control/protection: None  Other Topics Concern  . Not on file  Social History Narrative   Bachelors degree   Husband Preston,married 15 years.   Two daughters   Advertising account planner.   Social Determinants of Health   Financial Resource Strain: Low Risk   . Difficulty of Paying Living Expenses: Not hard at all  Food Insecurity: No Food Insecurity  . Worried About Programme researcher, broadcasting/film/video in the Last Year: Never true  . Ran Out of Food in the Last Year: Never true  Transportation Needs: No Transportation Needs  . Lack of Transportation (Medical): No  . Lack of Transportation (Non-Medical): No  Physical Activity: Insufficiently Active  . Days of Exercise per Week: 1 day  . Minutes of Exercise per Session: 30 min  Stress: No Stress Concern Present  . Feeling of Stress : Only a little  Social Connections: Not Isolated  . Frequency of Communication with Friends and Family: More than three times a week  . Frequency of Social Gatherings with Friends and Family: Once a week  . Attends Religious Services: More than 4 times per year  . Active Member of Clubs or Organizations: Yes  . Attends Banker Meetings: 1 to 4 times per year  . Marital Status: Married    Family History  Problem Relation Age of Onset  . Hypertension Mother   .  Thyroid disease Mother   . Hypertension Father   . Rheum arthritis Maternal Grandfather   . Stroke Paternal Grandmother   . Alcohol abuse Paternal Grandfather   . Rheum arthritis Cousin      Current Outpatient Medications:  .  Ascorbic Acid (VITAMIN C) 1000 MG tablet, Take 1,000 mg by mouth daily., Disp: , Rfl:  .  Cholecalciferol (D-3-5) 125 MCG (5000 UT) capsule, 10,000 Units daily. , Disp: , Rfl:  .  Multiple Vitamins-Minerals (MULTIPLE VITAMINS/WOMENS PO), daily. , Disp: , Rfl:  .  VIORELE 0.15-0.02/0.01 MG (21/5) tablet, TAKE 1 TABLET BY MOUTH EVERY DAY(TAKE ACTIVE PILLS CONTINUOUSLY., ONLY SKIP 1 WEEK EVERY 3 MONTHS), Disp: 84 tablet, Rfl: 3 .  hydrocortisone (ANUSOL-HC) 2.5 % rectal cream, Place 1 application rectally 2 (two) times daily., Disp: 30 g, Rfl: 2  Review of Systems  Review of Systems   Constitutional: Negative for fever, chills, weight loss, malaise/fatigue and diaphoresis.  HENT: Negative for hearing loss, ear pain, nosebleeds, congestion, sore throat, neck pain, tinnitus and ear discharge.   Eyes: Negative for blurred vision, double vision, photophobia, pain, discharge and redness.  Respiratory: Negative for cough, hemoptysis, sputum production, shortness of breath, wheezing and stridor.   Cardiovascular: Negative for chest pain, palpitations, orthopnea, claudication, leg swelling and PND.  Gastrointestinal: negative for abdominal pain. Negative for heartburn, nausea, vomiting, diarrhea, constipation, blood in stool and melena.  Genitourinary: Negative for dysuria, urgency, frequency, hematuria and flank pain.  Musculoskeletal: Negative for myalgias, back pain, joint pain and falls.  Skin: Negative for itching and rash.  Neurological: Negative for dizziness, tingling, tremors, sensory change, speech change, focal weakness, seizures, loss of consciousness, weakness and headaches.  Endo/Heme/Allergies: Negative for environmental allergies and polydipsia. Does not bruise/bleed easily.  Psychiatric/Behavioral: Negative for depression, suicidal ideas, hallucinations, memory loss and substance abuse. The patient is not nervous/anxious and does not have insomnia.        Objective:  Height 5\' 9"  (1.753 m), weight 231 lb (104.8 kg).   Physical Exam  Vitals reviewed. Constitutional: She is oriented to person, place, and time. She appears well-developed and well-nourished.        Vulva is normal without lesions Vagina is pink moist without discharge Cervix normal in appearance and pap is done Uterus is normal size shape and contour Adnexa is negative with normal sized ovaries   Musculoskeletal: Normal range of motion. She exhibits no edema and no tenderness.  Neurological: She is alert and oriented to person, place, and time. She has normal reflexes. She displays normal  reflexes. No cranial nerve deficit. She exhibits normal muscle tone. Coordination normal.  Skin: Skin is warm and dry. No rash noted. No erythema. No pallor.  Psychiatric: She has a normal mood and affect. Her behavior is normal. Judgment and thought content normal.       Medications Ordered at today's visit: Meds ordered this encounter  Medications  . hydrocortisone (ANUSOL-HC) 2.5 % rectal cream    Sig: Place 1 application rectally 2 (two) times daily.    Dispense:  30 g    Refill:  2    Other orders placed at today's visit: No orders of the defined types were placed in this encounter.     Assessment:    Normal Gyn exam.    Plan:    Contraception: OCP (estrogen/progesterone). Follow up in: 3 years.     Return in about 3 years (around 01/31/2023) for yearly, with Dr Elonda Husky.

## 2020-02-02 LAB — CYTOLOGY - PAP
Comment: NEGATIVE
Diagnosis: NEGATIVE
High risk HPV: NEGATIVE

## 2020-02-26 ENCOUNTER — Other Ambulatory Visit: Payer: Self-pay | Admitting: Obstetrics & Gynecology

## 2020-04-10 ENCOUNTER — Encounter (INDEPENDENT_AMBULATORY_CARE_PROVIDER_SITE_OTHER): Payer: Self-pay | Admitting: Nurse Practitioner

## 2020-04-10 ENCOUNTER — Other Ambulatory Visit: Payer: Self-pay

## 2020-04-10 ENCOUNTER — Other Ambulatory Visit: Payer: Self-pay | Admitting: Obstetrics & Gynecology

## 2020-04-10 ENCOUNTER — Ambulatory Visit (INDEPENDENT_AMBULATORY_CARE_PROVIDER_SITE_OTHER): Payer: BC Managed Care – PPO | Admitting: Nurse Practitioner

## 2020-04-10 VITALS — BP 120/90 | Temp 97.7°F | Ht 69.0 in | Wt 226.8 lb

## 2020-04-10 DIAGNOSIS — E785 Hyperlipidemia, unspecified: Secondary | ICD-10-CM | POA: Diagnosis not present

## 2020-04-10 DIAGNOSIS — E6609 Other obesity due to excess calories: Secondary | ICD-10-CM

## 2020-04-10 DIAGNOSIS — E559 Vitamin D deficiency, unspecified: Secondary | ICD-10-CM | POA: Diagnosis not present

## 2020-04-10 DIAGNOSIS — K649 Unspecified hemorrhoids: Secondary | ICD-10-CM

## 2020-04-10 DIAGNOSIS — Z6834 Body mass index (BMI) 34.0-34.9, adult: Secondary | ICD-10-CM

## 2020-04-10 NOTE — Progress Notes (Signed)
Subjective:  Patient ID: Sonya Chapman, female    DOB: [birth year]  Age: 39 y.o. MRN: [id number]  CC:  Chief Complaint  Patient presents with  . Follow-up  . Hemorrhoids  . Other    Obesity, vitamin D deficiency, hyperlipidemia      HPI  This patient arrives today for the above.  Hemorrhoids: She has a history of external hemorrhoids, last time we spoke she was interested in considering options to have them removed.  Since that conversation she has opted not to move forward in discussing surgical intervention due to dealing with other stresses in her life such as moving to [place] at this time.  Did not seem to bothering her much for flaring up currently.  Obesity: She continues to try to fast intermittently.  She has lost around 40 pounds since starting this.  She continues to fast 15 to 16 hours most days.  When she does eat she tries to focus on a healthy plant focused diet, but is not always successful.  Vitamin D deficiency: Last vitamin D level was 93 back in October 2020.  She continues on her 10,000 IUs of vitamin D3 daily.  Hyperlipidemia: She does have mild hyperlipidemia.  Last LDL was 115.  She is not on medication for this currently.   Past Medical History:  Diagnosis Date  . Allergy    environmental  . Cellulitis   . Endometrioma   . Hyperlipidemia 01/02/2020  . Hypertension       Family History  Problem Relation Age of Onset  . Hypertension Mother   . Thyroid disease Mother   . Hypertension Father   . Rheum arthritis Maternal Grandfather   . Stroke Paternal Grandmother   . Alcohol abuse Paternal Grandfather   . Rheum arthritis Cousin     Social History   Social History Narrative   Bachelors degree   Husband Preston,married 15 years.   Two daughters   Insurance underwriter.   Social History   Tobacco Use  . Smoking status: Never Smoker  . Smokeless tobacco: Never Used  Substance Use Topics  . Alcohol use: Yes    Alcohol/week: 0.0  standard drinks    Comment: occassional     Current Meds  Medication Sig  . Ascorbic Acid (VITAMIN C) 1000 MG tablet Take 1,000 mg by mouth daily.  . Cholecalciferol (D-3-5) 125 MCG (5000 UT) capsule 10,000 Units daily.   . fexofenadine (ALLEGRA) 60 MG tablet Take 60 mg by mouth daily.  . hydrocortisone (ANUSOL-HC) 2.5 % rectal cream Place 1 application rectally 2 (two) times daily.  . Multiple Vitamins-Minerals (MULTIPLE VITAMINS/WOMENS PO) daily.   Marland Kitchen VIORELE 0.15-0.02/0.01 MG (21/5) tablet TAKE 1 TABLET BY MOUTH EVERY DAY(TAKE ACTIVE PILLS CONTINUOUSLY., ONLY SKIP 1 WEEK EVERY 3 MONTHS)    ROS:  Review of Systems  Constitutional: Negative for fever, malaise/fatigue and weight loss.  Eyes: Negative for blurred vision and double vision.  Respiratory: Negative for cough, shortness of breath and wheezing.   Cardiovascular: Negative for chest pain and palpitations.  Neurological: Negative for dizziness and headaches.     Objective:   Today's Vitals: BP 120/90 (BP Location: Left Arm, Patient Position: Sitting, Cuff Size: Normal)   Temp 97.7 F (36.5 C) (Temporal)   Ht 5\' 9"  (1.753 m)   Wt 226 lb 12.8 oz (102.9 kg)   BMI 33.49 kg/m  Vitals with BMI 04/10/2020 01/31/2020 01/02/2020  Height 5\' 9"  5\' 9"  5\' 9"   Weight  226 lbs 13 oz 231 lbs 231 lbs 6 oz  BMI 33.48 34.1 34.16  Systolic 120 - 121  Diastolic 90 - 80  Pulse - - 78     Physical Exam Vitals reviewed.  Constitutional:      General: She is not in acute distress.    Appearance: Normal appearance.  HENT:     Head: Normocephalic and atraumatic.  Neck:     Vascular: No carotid bruit.  Cardiovascular:     Rate and Rhythm: Normal rate and regular rhythm.     Pulses: Normal pulses.     Heart sounds: Normal heart sounds.  Pulmonary:     Effort: Pulmonary effort is normal.     Breath sounds: Normal breath sounds.  Skin:    General: Skin is warm and dry.  Neurological:     General: No focal deficit present.     Mental  Status: She is alert and oriented to person, place, and time.  Psychiatric:        Mood and Affect: Mood normal.        Behavior: Behavior normal.        Judgment: Judgment normal.          Assessment and Plan   1. Hemorrhoids, unspecified hemorrhoid type   2. Class 1 obesity due to excess calories without serious comorbidity with body mass index (BMI) of 34.0 to 34.9 in adult   3. Vitamin D deficiency   4. Hyperlipidemia, unspecified hyperlipidemia type      Plan: 1.  For now will hold off on recommending further evaluation and management via surgery.  She is encouraged that me know if she would like referral again in the future.  2., 4.  She will continue to try to intermittent fast as regularly as possible.  She also try to focus on healthy food choices when she eats.  3.  She will continue on her vitamin D3 supplement.    Tests ordered No orders of the defined types were placed in this encounter.     No orders of the defined types were placed in this encounter.   Patient to follow-up in 4 to 5 months at which time she will be due for annual physical exam.  Elenore Paddy, NP

## 2020-07-30 ENCOUNTER — Other Ambulatory Visit (HOSPITAL_COMMUNITY): Payer: Self-pay | Admitting: Internal Medicine

## 2020-07-30 DIAGNOSIS — Z1231 Encounter for screening mammogram for malignant neoplasm of breast: Secondary | ICD-10-CM

## 2020-09-11 ENCOUNTER — Encounter (INDEPENDENT_AMBULATORY_CARE_PROVIDER_SITE_OTHER): Payer: Self-pay | Admitting: Internal Medicine

## 2020-09-11 ENCOUNTER — Other Ambulatory Visit: Payer: Self-pay

## 2020-09-11 ENCOUNTER — Ambulatory Visit (INDEPENDENT_AMBULATORY_CARE_PROVIDER_SITE_OTHER): Payer: BC Managed Care – PPO | Admitting: Internal Medicine

## 2020-09-11 VITALS — BP 116/78 | HR 75 | Temp 97.7°F | Ht 68.25 in | Wt 228.2 lb

## 2020-09-11 DIAGNOSIS — E559 Vitamin D deficiency, unspecified: Secondary | ICD-10-CM

## 2020-09-11 DIAGNOSIS — Z23 Encounter for immunization: Secondary | ICD-10-CM | POA: Diagnosis not present

## 2020-09-11 DIAGNOSIS — E66811 Obesity, class 1: Secondary | ICD-10-CM

## 2020-09-11 DIAGNOSIS — Z1159 Encounter for screening for other viral diseases: Secondary | ICD-10-CM

## 2020-09-11 DIAGNOSIS — K649 Unspecified hemorrhoids: Secondary | ICD-10-CM

## 2020-09-11 DIAGNOSIS — E6609 Other obesity due to excess calories: Secondary | ICD-10-CM

## 2020-09-11 DIAGNOSIS — Z131 Encounter for screening for diabetes mellitus: Secondary | ICD-10-CM

## 2020-09-11 DIAGNOSIS — Z0001 Encounter for general adult medical examination with abnormal findings: Secondary | ICD-10-CM | POA: Diagnosis not present

## 2020-09-11 DIAGNOSIS — R5383 Other fatigue: Secondary | ICD-10-CM

## 2020-09-11 DIAGNOSIS — R5381 Other malaise: Secondary | ICD-10-CM

## 2020-09-11 DIAGNOSIS — Z6834 Body mass index (BMI) 34.0-34.9, adult: Secondary | ICD-10-CM

## 2020-09-11 DIAGNOSIS — E785 Hyperlipidemia, unspecified: Secondary | ICD-10-CM

## 2020-09-11 NOTE — Addendum Note (Signed)
Addended by: Ronita Hipps on: 09/11/2020 08:52 AM   Modules accepted: Orders

## 2020-09-11 NOTE — Progress Notes (Signed)
Chief Complaint: This 39 year old lady comes to our practice for an annual physical exam. HPI: She is doing reasonably well but she does appear to have some pruritus associated with some external hemorrhoids.  She has had hemorrhoids reportedly for more than 6 months now which are causing her symptoms.  Anusol HC did not seem to help her. Other than this, she has vitamin D deficiency for which she takes vitamin D D3 supplementation. She has hyperlipidemia which has not required statin therapy.  Past Medical History:  Diagnosis Date  . Allergy    environmental  . Cellulitis   . Endometrioma   . Hyperlipidemia 01/02/2020  . Hypertension    Past Surgical History:  Procedure Laterality Date  . CESAREAN SECTION    . CESAREAN SECTION N/A 04/03/2017   Procedure: REPEAT CESAREAN SECTION;  Surgeon: Lazaro Arms, MD;  Location: [facility] BIRTHING SUITES;  Service: Obstetrics;  Laterality: N/A;  . EXCISION OF ENDOMETRIOMA  12/26/2015   Procedure: RESECTION OF PERIUMBILICAL ENDOMETRIOMA;  Surgeon: Lazaro Arms, MD;  Location: [facility] ORS;  Service: Gynecology;;  . [name] TOOTH EXTRACTION       Social History   Social History Narrative   Bachelors degree   Husband Preston,married 16 years.   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    Social History   Tobacco Use  . Smoking status: Never Smoker  . Smokeless tobacco: Never Used  Substance Use Topics  . Alcohol use: Yes    Alcohol/week: 0.0 standard drinks    Comment: occassional      Allergies:  Allergies  Allergen Reactions  . Penicillins Rash    Has patient had a PCN reaction causing immediate rash, facial/tongue/throat swelling, SOB or lightheadedness with hypotension: No Has patient had a PCN reaction causing severe rash involving mucus membranes or skin necrosis: No Has patient had a PCN reaction that required hospitalization No Has patient had a PCN reaction occurring within the last 10 years: No If all of the above  answers are "NO", then may proceed with Cephalosporin use.      Current Meds  Medication Sig  . Ascorbic Acid (VITAMIN C) 1000 MG tablet Take 1,000 mg by mouth daily.  . Cholecalciferol (D-3-5) 125 MCG (5000 UT) capsule 10,000 Units daily.   . fexofenadine (ALLEGRA) 60 MG tablet Take 60 mg by mouth daily.  . hydrocortisone (ANUSOL-HC) 2.5 % rectal cream Place 1 application rectally 2 (two) times daily.  . Multiple Vitamins-Minerals (MULTIPLE VITAMINS/WOMENS PO) daily.   Marland Kitchen VOLNEA 0.15-0.02/0.01 MG (21/5) tablet TAKE 1 TABLET BY MOUTH EVERY DAY (TAKE ACTIVE PILLS CONTINUOUSLY., ONLY SKIP 1 WEEK EVERY 3 MONTHS)       Depression screen [facility] 2/9 09/11/2020 04/10/2020 01/31/2020 11/30/2018 07/09/2017  Decreased Interest 0 0 0 1 0  Down, Depressed, Hopeless 0 0 0 1 0  PHQ - 2 Score 0 0 0 2 0  Altered sleeping 0 - 1 0 -  Tired, decreased energy 0 - 1 1 -  Change in appetite 0 - 1 0 -  Feeling bad or failure about yourself  0 - 0 0 -  Trouble concentrating 0 - 0 1 -  Moving slowly or fidgety/restless 0 - 0 0 -  Suicidal thoughts 0 - 0 0 -  PHQ-9 Score 0 - 3 4 -  Difficult doing work/chores Not difficult at all - - Not difficult at all -     ALP:FXTKW from the symptoms mentioned above,there are no  other symptoms referable to all systems reviewed.       Physical Exam: Blood pressure 116/78, pulse 75, temperature 97.7 F (36.5 C), temperature source Temporal, height 5' 8.25" (1.734 m), weight 228 lb 3.2 oz (103.5 kg), SpO2 99 %. Vitals with BMI 09/11/2020 04/10/2020 01/31/2020  Height 5' 8.25" 5\' 9"  5\' 9"   Weight 228 lbs 3 oz 226 lbs 13 oz 231 lbs  BMI 34.43 33.48 34.1  Systolic 116 120 -  Diastolic 78 90 -  Pulse 75 - -      She looks systemically well.  She remains obese.  She has gained 2 pounds since the last time she was seen.  Since 2019, she has managed to lose more than 30 pounds. General: Alert, cooperative, and appears to be the stated age.No pallor.  No jaundice.  No  clubbing. Head: Normocephalic Eyes: Sclera white, pupils equal and reactive to light, red reflex x 2,  Ears: Normal bilaterally Oral cavity: Lips, mucosa, and tongue normal: Teeth and gums normal Neck: No adenopathy, supple, symmetrical, trachea midline, and thyroid does not appear enlarged. Breast: No masses felt. Respiratory: Clear to auscultation bilaterally.No wheezing, crackles or bronchial breathing. Cardiovascular: Heart sounds are present and appear to be normal without murmurs or added sounds.  No carotid bruits.  Peripheral pulses are present and equal bilaterally.: Gastrointestinal:positive bowel sounds, no hepatosplenomegaly.  No masses felt.No tenderness. Skin: Clear, No rashes noted.No worrisome skin lesions seen. Neurological: Grossly intact without focal findings, cranial nerves II through XII intact, muscle strength equal bilaterally Musculoskeletal: No acute joint abnormalities noted.Full range of movement noted with joints. Psychiatric: Affect appropriate, non-anxious.    Assessment  1. Encounter for general adult medical examination with abnormal findings   2. Encounter for hepatitis C screening test for low risk patient   3. Hemorrhoids, unspecified hemorrhoid type   4. Class 1 obesity due to excess calories without serious comorbidity with body mass index (BMI) of 34.0 to 34.9 in adult   5. Vitamin D deficiency   6. Hyperlipidemia, unspecified hyperlipidemia type   7. Malaise and fatigue   8. Screening for diabetes mellitus     Tests Ordered:   Orders Placed This Encounter  Procedures  . Hepatitis C antibody  . CBC  . COMPLETE METABOLIC PANEL WITH GFR  . Hemoglobin A1c  . Lipid panel  . T3, free  . T4  . TSH  . VITAMIN D 25 Hydroxy (Vit-D Deficiency, Fractures)  . Ambulatory referral to Gastroenterology     Plan  1. Blood work is ordered. 2. I will refer her to gastroenterology in [facility] for her probable hemorrhoids external. 3. Influenza  vaccination was given today. 4. Further recommendations will depend on blood results and I will have her follow-up with Sarah in about 4 months time.     No orders of the defined types were placed in this encounter.    Eon Zunker C Dymond Gutt   09/11/2020, 8:34 AM

## 2020-09-13 ENCOUNTER — Other Ambulatory Visit: Payer: Self-pay

## 2020-09-13 ENCOUNTER — Ambulatory Visit (HOSPITAL_COMMUNITY)
Admission: RE | Admit: 2020-09-13 | Discharge: 2020-09-13 | Disposition: A | Discharge status: Home / Self Care | Payer: BC Managed Care – PPO | Source: Ambulatory Visit | Attending: Internal Medicine | Admitting: Internal Medicine

## 2020-09-13 DIAGNOSIS — E559 Vitamin D deficiency, unspecified: Secondary | ICD-10-CM | POA: Diagnosis not present

## 2020-09-13 DIAGNOSIS — E785 Hyperlipidemia, unspecified: Secondary | ICD-10-CM | POA: Diagnosis not present

## 2020-09-13 DIAGNOSIS — R5383 Other fatigue: Secondary | ICD-10-CM | POA: Diagnosis not present

## 2020-09-13 DIAGNOSIS — Z1231 Encounter for screening mammogram for malignant neoplasm of breast: Secondary | ICD-10-CM | POA: Diagnosis not present

## 2020-09-13 DIAGNOSIS — R5381 Other malaise: Secondary | ICD-10-CM | POA: Diagnosis not present

## 2020-09-13 DIAGNOSIS — Z131 Encounter for screening for diabetes mellitus: Secondary | ICD-10-CM | POA: Diagnosis not present

## 2020-09-14 LAB — COMPLETE METABOLIC PANEL WITH GFR
AG Ratio: 1.6 (calc) (ref 1.0–2.5)
ALT: 6 U/L (ref 6–29)
AST: 13 U/L (ref 10–30)
Albumin: 4.1 g/dL (ref 3.6–5.1)
Alkaline phosphatase (APISO): 60 U/L (ref 31–125)
BUN: 11 mg/dL (ref 7–25)
CO2: 25 mmol/L (ref 20–32)
Calcium: 9.1 mg/dL (ref 8.6–10.2)
Chloride: 103 mmol/L (ref 98–110)
Creat: 0.92 mg/dL (ref 0.50–1.10)
GFR, Est African American: 90 mL/min/{1.73_m2} (ref >=60)
GFR, Est Non African American: 77 mL/min/{1.73_m2} (ref >=60)
Globulin: 2.6 g/dL (calc) (ref 1.9–3.7)
Glucose, Bld: 74 mg/dL (ref 65–99)
Potassium: 4.3 mmol/L (ref 3.5–5.3)
Sodium: 138 mmol/L (ref 135–146)
Total Bilirubin: 0.5 mg/dL (ref 0.2–1.2)
Total Protein: 6.7 g/dL (ref 6.1–8.1)

## 2020-09-14 LAB — LIPID PANEL
Cholesterol: 207 mg/dL — ABNORMAL HIGH (ref <200)
HDL: 74 mg/dL (ref >=50)
LDL Cholesterol (Calc): 106 mg/dL (calc) — ABNORMAL HIGH
Non-HDL Cholesterol (Calc): 133 mg/dL (calc) — ABNORMAL HIGH (ref <130)
Total CHOL/HDL Ratio: 2.8 (calc) (ref <5.0)
Triglycerides: 155 mg/dL — ABNORMAL HIGH (ref <150)

## 2020-09-14 LAB — HEPATITIS C ANTIBODY
Hepatitis C Ab: NONREACTIVE
SIGNAL TO CUT-OFF: 0.02 (ref <1.00)

## 2020-09-14 LAB — VITAMIN D 25 HYDROXY (VIT D DEFICIENCY, FRACTURES): Vit D, 25-Hydroxy: 100 ng/mL (ref 30–100)

## 2020-09-14 LAB — CBC
HCT: 41.6 % (ref 35.0–45.0)
Hemoglobin: 13.9 g/dL (ref 11.7–15.5)
MCH: 30.1 pg (ref 27.0–33.0)
MCHC: 33.4 g/dL (ref 32.0–36.0)
MCV: 90 fL (ref 80.0–100.0)
MPV: 10.9 fL (ref 7.5–12.5)
Platelets: 216 10*3/uL (ref 140–400)
RBC: 4.62 10*6/uL (ref 3.80–5.10)
RDW: 12.2 % (ref 11.0–15.0)
WBC: 5.2 10*3/uL (ref 3.8–10.8)

## 2020-09-14 LAB — T4: T4, Total: 14.3 ug/dL — ABNORMAL HIGH (ref 5.1–11.9)

## 2020-09-14 LAB — T3, FREE: T3, Free: 3.2 pg/mL (ref 2.3–4.2)

## 2020-09-14 LAB — HEMOGLOBIN A1C
Hgb A1c MFr Bld: 5.3 % of total Hgb (ref <5.7)
Mean Plasma Glucose: 105 (calc)
eAG (mmol/L): 5.8 (calc)

## 2020-09-14 LAB — TSH: TSH: 2.65 mIU/L

## 2020-09-17 ENCOUNTER — Encounter (INDEPENDENT_AMBULATORY_CARE_PROVIDER_SITE_OTHER): Payer: Self-pay | Admitting: Internal Medicine

## 2020-09-18 ENCOUNTER — Encounter (INDEPENDENT_AMBULATORY_CARE_PROVIDER_SITE_OTHER): Payer: Self-pay | Admitting: Internal Medicine

## 2020-09-18 ENCOUNTER — Telehealth (INDEPENDENT_AMBULATORY_CARE_PROVIDER_SITE_OTHER): Payer: BC Managed Care – PPO | Admitting: Internal Medicine

## 2020-09-18 VITALS — Ht 68.25 in | Wt 228.0 lb

## 2020-09-18 DIAGNOSIS — E6609 Other obesity due to excess calories: Secondary | ICD-10-CM

## 2020-09-18 DIAGNOSIS — R5383 Other fatigue: Secondary | ICD-10-CM

## 2020-09-18 DIAGNOSIS — R5381 Other malaise: Secondary | ICD-10-CM

## 2020-09-18 DIAGNOSIS — Z6834 Body mass index (BMI) 34.0-34.9, adult: Secondary | ICD-10-CM | POA: Diagnosis not present

## 2020-09-18 MED ORDER — NP THYROID 30 MG PO TABS: 30.0000 mg | ORAL_TABLET | Freq: Every day | ORAL | 3 refills | Status: DC

## 2020-09-18 NOTE — Progress Notes (Signed)
Metrics: Intervention Frequency ACO  Documented Smoking Status Yearly  Screened one or more times in 24 months  Cessation Counseling or  Active cessation medication Past 24 months  Past 24 months   Guideline developer: UpToDate (See UpToDate for funding source) Date Released: 2014       Wellness Office Visit  Subjective:  Patient ID: Shalece Staffa, female    DOB: October 21, 1981  Age: 39 y.o. MRN: [id number]  CC: This is an audio telemedicine visit with the permission of the patient who is at work and I am in my office.  I used 2 identifiers to identify the patient. Questions regarding her T4 level. HPI  The patient had question regarding elevation in total T4 level.  She has no symptoms of hyperthyroidism.  I explained the full thyroid panel to her today.  On closer questioning, she does appear to have symptoms in fact of thyroid hypofunction.  This includes weight gain, malaise, somewhat of a dry skin. Past Medical History:  Diagnosis Date  . Allergy    environmental  . Cellulitis   . Endometrioma   . Hyperlipidemia 01/02/2020  . Hypertension    Past Surgical History:  Procedure Laterality Date  . CESAREAN SECTION    . CESAREAN SECTION N/A 04/03/2017   Procedure: REPEAT CESAREAN SECTION;  Surgeon: Lazaro Arms, MD;  Location: [facility] BIRTHING SUITES;  Service: Obstetrics;  Laterality: N/A;  . EXCISION OF ENDOMETRIOMA  12/26/2015   Procedure: RESECTION OF PERIUMBILICAL ENDOMETRIOMA;  Surgeon: Lazaro Arms, MD;  Location: [facility];  Service: Gynecology;;  . WISDOM TOOTH EXTRACTION       Family History  Problem Relation Age of Onset  . Hypertension Mother   . Thyroid disease Mother   . Breast cancer Mother   . Hypertension Father   . Rheum arthritis Maternal Grandfather   . Stroke Paternal Grandmother   . Alcohol abuse Paternal Grandfather   . Rheum arthritis Cousin     Social History   Social History Narrative   Bachelors degree   Husband Preston,married 16 years.   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   Social History   Tobacco Use  . Smoking status: Never Smoker  . Smokeless tobacco: Never Used  Substance Use Topics  . Alcohol use: Yes    Alcohol/week: 0.0 standard drinks    Comment: occassional    Current Meds  Medication Sig  . Ascorbic Acid (VITAMIN C) 1000 MG tablet Take 1,000 mg by mouth daily.  . Cholecalciferol (D-3-5) 125 MCG (5000 UT) capsule 10,000 Units daily.   . fexofenadine (ALLEGRA) 60 MG tablet Take 60 mg by mouth daily.  . hydrocortisone (ANUSOL-HC) 2.5 % rectal cream Place 1 application rectally 2 (two) times daily.  . Multiple Vitamins-Minerals (MULTIPLE VITAMINS/WOMENS PO) daily.   Marland Kitchen VOLNEA 0.15-0.02/0.01 MG (21/5) tablet TAKE 1 TABLET BY MOUTH EVERY DAY (TAKE ACTIVE PILLS CONTINUOUSLY., ONLY SKIP 1 WEEK EVERY 3 MONTHS)      Depression screen [facility] 2/9 09/11/2020 04/10/2020 01/31/2020 11/30/2018 07/09/2017  Decreased Interest 0 0 0 1 0  Down, Depressed, Hopeless 0 0 0 1 0  PHQ - 2 Score 0 0 0 2 0  Altered sleeping 0 - 1 0 -  Tired, decreased energy 0 - 1 1 -  Change in appetite 0 - 1 0 -  Feeling bad or failure about yourself  0 - 0 0 -  Trouble concentrating 0 - 0 1 -  Moving slowly or fidgety/restless  0 - 0 0 -  Suicidal thoughts 0 - 0 0 -  PHQ-9 Score 0 - 3 4 -  Difficult doing work/chores Not difficult at all - - Not difficult at all -     Objective:   Today's Vitals: Ht 5' 8.25" (1.734 m)   Wt 228 lb (103.4 kg)   BMI 34.41 kg/m  Vitals with BMI 09/18/2020 09/11/2020 04/10/2020  Height 5' 8.25" 5' 8.25" 5\' 9"   Weight 228 lbs 228 lbs 3 oz 226 lbs 13 oz  BMI 34.4 34.43 33.48  Systolic (No Data) 116 120  Diastolic (No Data) 78 90  Pulse (No Data) 75 -     Physical Exam  She appears to be alert and orientated on the phone.     Assessment   1. Malaise and fatigue   2. Class 1 obesity due to excess calories without serious comorbidity with body mass index (BMI) of 34.0 to 34.9 in adult        Tests ordered No orders of the defined types were placed in this encounter.    Plan: 1. I explained we should have measured free T4 instead of total T4 and her symptomatology is more indicative of thyroid hypofunction.  After shared decision making, we agreed to start her on NP thyroid 30 mg once a day, off label for her symptoms.  I explained possible side effects and how to deal with them. 2. In about 6 weeks time she will get blood work done and we will go from there. 3. This phone call lasted 11 minutes.   Meds ordered this encounter  Medications  . NP THYROID 30 MG tablet    Sig: Take 1 tablet (30 mg total) by mouth daily before breakfast.    Dispense:  30 tablet    Refill:  3    Kesler Wickham , MD

## 2020-09-20 ENCOUNTER — Telehealth (INDEPENDENT_AMBULATORY_CARE_PROVIDER_SITE_OTHER): Payer: BC Managed Care – PPO | Admitting: Internal Medicine

## 2020-11-07 ENCOUNTER — Other Ambulatory Visit: Payer: Self-pay

## 2020-11-07 ENCOUNTER — Encounter: Payer: Self-pay | Admitting: Gastroenterology

## 2020-11-07 ENCOUNTER — Ambulatory Visit: Payer: BC Managed Care – PPO | Admitting: Gastroenterology

## 2020-11-07 VITALS — BP 140/84 | HR 80 | Temp 97.9°F | Ht 69.0 in | Wt 234.8 lb

## 2020-11-07 DIAGNOSIS — K649 Unspecified hemorrhoids: Secondary | ICD-10-CM

## 2020-11-07 MED ORDER — HYDROCORTISONE ACETATE 25 MG RE SUPP: 25.0000 mg | Freq: Two times a day (BID) | RECTAL | 0 refills | Status: DC

## 2020-11-07 NOTE — Progress Notes (Signed)
Sonya Chapman [address]  [address]  [place], [place] [address]  Main: [phone]  Fax: [phone]   Gastroenterology Consultation  Referring Provider:     Wilson Singer, MD Primary Care Physician:  Wilson Singer, MD Reason for Consultation:     Hemorrhoids        HPI:    Chief Complaint  Patient presents with  . Hemorrhoids    Sonya Chapman is a 40 y.o. y/o female referred for consultation & management  by Dr. Karilyn Chapman, Sonya Herter, MD.  Patient reports 1 year history of itching, burning sensation in the perianal region, and feeling of prolapse lesion intermittently.  Denies any significant bleeding.  States very occasionally if she has wiped too much, she will notice a very small streak of red blood on the tissue paper only.  Has never seen blood within the stool or toilet bowl.  No prior colonoscopy.  No family history of colon cancer.  Reports 1-2 small bowel movements a day, with some straining sometimes.  Tries to eat a high-fiber diet.  The patient denies abdominal or flank pain, anorexia, nausea or vomiting, dysphagia, change in bowel habits or black or bloody stools or weight loss.   Past Medical History:  Diagnosis Date  . Allergy    environmental  . Cellulitis   . Endometrioma   . Hyperlipidemia 01/02/2020  . Hypertension     Past Surgical History:  Procedure Laterality Date  . CESAREAN SECTION    . CESAREAN SECTION N/A 04/03/2017   Procedure: REPEAT CESAREAN SECTION;  Surgeon: Lazaro Arms, MD;  Location: [facility] BIRTHING SUITES;  Service: Obstetrics;  Laterality: N/A;  . EXCISION OF ENDOMETRIOMA  12/26/2015   Procedure: RESECTION OF PERIUMBILICAL ENDOMETRIOMA;  Surgeon: Lazaro Arms, MD;  Location: [facility];  Service: Gynecology;;  . Sonya Chapman TOOTH EXTRACTION      Prior to Admission medications   Medication Sig Start Date End Date Taking? Authorizing Provider  Ascorbic Acid (VITAMIN C) 1000 MG tablet Take 1,000 mg by mouth daily.   Yes  [provider]  Cholecalciferol 125 MCG (5000 UT) capsule 10,000 Units daily.    Yes [provider]  fexofenadine (ALLEGRA) 60 MG tablet Take 60 mg by mouth daily.   Yes [provider]  hydrocortisone (ANUSOL-HC) 25 MG suppository Place 1 suppository (25 mg total) rectally 2 (two) times daily. 11/07/20  Yes Pasty Spillers, MD  Multiple Vitamins-Minerals (MULTIPLE VITAMINS/WOMENS PO) daily.    Yes [provider]  VOLNEA 0.15-0.02/0.01 MG (21/5) tablet TAKE 1 TABLET BY MOUTH EVERY DAY (TAKE ACTIVE PILLS CONTINUOUSLY., ONLY SKIP 1 WEEK EVERY 3 MONTHS) 04/10/20  Yes Lazaro Arms, MD  hydrocortisone (ANUSOL-HC) 2.5 % rectal cream Place 1 application rectally 2 (two) times daily. Patient not taking: Reported on 11/07/2020 01/31/20   Lazaro Arms, MD    Family History  Problem Relation Age of Onset  . Hypertension Mother   . Thyroid disease Mother   . Breast cancer Mother   . Hypertension Father   . Rheum arthritis Maternal Grandfather   . Stroke Paternal Grandmother   . Alcohol abuse Paternal Grandfather   . Rheum arthritis Cousin      Social History   Tobacco Use  . Smoking status: Never Smoker  . Smokeless tobacco: Never Used  Vaping Use  . Vaping Use: Never used  Substance Use Topics  . Alcohol use: Yes    Alcohol/week: 0.0 standard drinks  Comment: occassional  . Drug use: No    Allergies as of 11/07/2020 - Review Complete 11/07/2020  Allergen Reaction Noted  . Penicillins Rash 09/25/2015    Review of Systems:    All systems reviewed and negative except where noted in HPI.   Physical Exam:  BP 140/84   Pulse 80   Temp 97.9 F (36.6 C) (Oral)   Ht 5\' 9"  (1.753 m)   Wt 234 lb 12.8 oz (106.5 kg)   BMI 34.67 kg/m  No LMP recorded. (Menstrual status: Oral contraceptives). Psych:  Alert and cooperative. Normal mood and affect. General:   Alert,  Well-developed, well-nourished, pleasant and cooperative in NAD Head:   Normocephalic and atraumatic. Eyes:  Sclera clear, no icterus.   Conjunctiva pink. Ears:  Normal auditory acuity. Nose:  No deformity, discharge, or lesions. Mouth:  No deformity or lesions,oropharynx pink & moist. Neck:  Supple; no masses or thyromegaly. Abdomen:  Normal bowel sounds.  No bruits.  Soft, non-tender and non-distended without masses, hepatosplenomegaly or hernias noted.  No guarding or rebound tenderness.    Rectal exam: No perianal lesions present.  No external hemorrhoids seen.  Digital rectal exam with no masses in the rectal vault, brown stool in the rectal vault present. Msk:  Symmetrical without gross deformities. Good, equal movement & strength bilaterally. Pulses:  Normal pulses noted. Extremities:  No clubbing or edema.  No cyanosis. Neurologic:  Alert and oriented x3;  grossly normal neurologically. Skin:  Intact without significant lesions or rashes. No jaundice. Lymph Nodes:  No significant cervical adenopathy. Psych:  Alert and cooperative. Normal mood and affect.   Labs: CBC    Component Value Date/Time   WBC 5.2 09/13/2020 0822   RBC 4.62 09/13/2020 0822   HGB 13.9 09/13/2020 0822   HGB 12.4 01/14/2017 0858   HCT 41.6 09/13/2020 0822   HCT 36.9 01/14/2017 0858   PLT 216 09/13/2020 0822   PLT 195 01/14/2017 0858   MCV 90.0 09/13/2020 0822   MCV 89 01/14/2017 0858   MCH 30.1 09/13/2020 0822   MCHC 33.4 09/13/2020 0822   RDW 12.2 09/13/2020 0822   RDW 14.8 01/14/2017 0858   CMP     Component Value Date/Time   NA 138 09/13/2020 0822   K 4.3 09/13/2020 0822   CL 103 09/13/2020 0822   CO2 25 09/13/2020 0822   GLUCOSE 74 09/13/2020 0822   BUN 11 09/13/2020 0822   CREATININE 0.92 09/13/2020 0822   CALCIUM 9.1 09/13/2020 0822   PROT 6.7 09/13/2020 0822   ALBUMIN 4.7 12/21/2015 0915   AST 13 09/13/2020 0822   ALT 6 09/13/2020 0822   ALKPHOS 86 12/21/2015 0915   BILITOT 0.5 09/13/2020 0822   GFRNONAA 77 09/13/2020 0822   GFRAA 90 09/13/2020  0822    Imaging Studies: No results found.  Assessment and Plan:   [name] is a 40 y.o. y/o female has been referred for hemorrhoids  Patient's clinical history of feeling of prolapsed lesion that self reduces intermittently, and no evidence of external perianal lesions, likely points to possible internal hemorrhoids as a cause of her symptoms  Her labs are otherwise very reassuring with no anemia.  No indication for urgent endoscopy at this time  Treat with steroid suppositories at this time and if symptoms do not improve, consider colonoscopy  High-fiber diet discussed and if patient is not at goal of 1-2 soft bowel movements a day with high-fiber diet I have asked her to start  taking fiber supplement over-the-counter or Metamucil or MiraLAX once a day for 1 to 2 months  Follow-up in 8- 12 weeks to reassess symptoms  Dr Vonda Antigua  Speech recognition software was used to dictate the above note.

## 2020-11-07 NOTE — Patient Instructions (Signed)
Please let us know if your symptoms are not resolved so we could consider doing a colonoscopy.   High-Fiber Diet Fiber, also called dietary fiber, is a type of carbohydrate that is found in fruits, vegetables, whole grains, and beans. A high-fiber diet can have many health benefits. Your health care provider may recommend a high-fiber diet to help:  Prevent constipation. Fiber can make your bowel movements more regular.  Lower your cholesterol.  Relieve the following conditions: ? Swelling of veins in the anus (hemorrhoids). ? Swelling and irritation (inflammation) of specific areas of the digestive tract (uncomplicated diverticulosis). ? A problem of the large intestine (colon) that sometimes causes pain and diarrhea (irritable bowel syndrome, IBS).  Prevent overeating as part of a weight-loss plan.  Prevent heart disease, type 2 diabetes, and certain cancers. What is my plan? The recommended daily fiber intake in grams (g) includes:  38 g for men age 90 or younger.  30 g for men over age 70.  25 g for women age 85 or younger.  21 g for women over age 85. You can get the recommended daily intake of dietary fiber by:  Eating a variety of fruits, vegetables, grains, and beans.  Taking a fiber supplement, if it is not possible to get enough fiber through your diet. What do I need to know about a high-fiber diet?  It is better to get fiber through food sources rather than from fiber supplements. There is not a lot of research about how effective supplements are.  Always check the fiber content on the nutrition facts label of any prepackaged food. Look for foods that contain 5 g of fiber or more per serving.  Talk with a diet and nutrition specialist (dietitian) if you have questions about specific foods that are recommended or not recommended for your medical condition, especially if those foods are not listed below.  Gradually increase how much fiber you consume. If you increase  your intake of dietary fiber too quickly, you may have bloating, cramping, or gas.  Drink plenty of water. Water helps you to digest fiber. What are tips for following this plan?  Eat a wide variety of high-fiber foods.  Make sure that half of the grains that you eat each day are whole grains.  Eat breads and cereals that are made with whole-grain flour instead of refined flour or white flour.  Eat brown rice, bulgur wheat, or millet instead of white rice.  Start the day with a breakfast that is high in fiber, such as a cereal that contains 5 g of fiber or more per serving.  Use beans in place of meat in soups, salads, and pasta dishes.  Eat high-fiber snacks, such as berries, raw vegetables, nuts, and popcorn.  Choose whole fruits and vegetables instead of processed forms like juice or sauce. What foods can I eat?  Fruits Berries. Pears. Apples. Oranges. Avocado. Prunes and raisins. Dried figs. Vegetables Sweet potatoes. Spinach. Kale. Artichokes. Cabbage. Broccoli. Cauliflower. Green peas. Carrots. Squash. Grains Whole-grain breads. Multigrain cereal. Oats and oatmeal. Brown rice. Barley. Bulgur wheat. Millet. Quinoa. Bran muffins. Popcorn. Rye wafer crackers. Meats and other proteins Navy, kidney, and pinto beans. Soybeans. Split peas. Lentils. Nuts and seeds. Dairy Fiber-fortified yogurt. Beverages Fiber-fortified soy milk. Fiber-fortified orange juice. Other foods Fiber bars. The items listed above may not be a complete list of recommended foods and beverages. Contact a dietitian for more options. What foods are not recommended? Fruits Fruit juice. Cooked, strained fruit. Vegetables  Fried potatoes. Canned vegetables. Well-cooked vegetables. Grains White bread. Pasta made with refined flour. White rice. Meats and other proteins Fatty cuts of meat. Fried chicken or fried fish. Dairy Milk. Yogurt. Cream cheese. Sour cream. Fats and oils Butters. Beverages Soft  drinks. Other foods Cakes and pastries. The items listed above may not be a complete list of foods and beverages to avoid. Contact a dietitian for more information. Summary  Fiber is a type of carbohydrate. It is found in fruits, vegetables, whole grains, and beans.  There are many health benefits of eating a high-fiber diet, such as preventing constipation, lowering blood cholesterol, helping with weight loss, and reducing your risk of heart disease, diabetes, and certain cancers.  Gradually increase your intake of fiber. Increasing too fast can result in cramping, bloating, and gas. Drink plenty of water while you increase your fiber.  The best sources of fiber include whole fruits and vegetables, whole grains, nuts, seeds, and beans. This information is not intended to replace advice given to you by your health care provider. Make sure you discuss any questions you have with your health care provider. Document Revised: 08/24/2017 Document Reviewed: 08/24/2017 Elsevier Patient Education  2020 [employer].

## 2020-11-28 DIAGNOSIS — M79674 Pain in right toe(s): Secondary | ICD-10-CM | POA: Diagnosis not present

## 2020-12-20 DIAGNOSIS — M79674 Pain in right toe(s): Secondary | ICD-10-CM | POA: Diagnosis not present

## 2020-12-20 DIAGNOSIS — M2021 Hallux rigidus, right foot: Secondary | ICD-10-CM | POA: Diagnosis not present

## 2021-01-09 ENCOUNTER — Ambulatory Visit (INDEPENDENT_AMBULATORY_CARE_PROVIDER_SITE_OTHER): Payer: BC Managed Care – PPO | Admitting: Nurse Practitioner

## 2021-02-05 ENCOUNTER — Ambulatory Visit: Payer: BC Managed Care – PPO | Admitting: Gastroenterology

## 2021-02-12 ENCOUNTER — Other Ambulatory Visit: Payer: Self-pay | Admitting: Obstetrics & Gynecology

## 2021-06-13 DIAGNOSIS — W5501XA Bitten by cat, initial encounter: Secondary | ICD-10-CM | POA: Diagnosis not present

## 2021-06-13 DIAGNOSIS — L821 Other seborrheic keratosis: Secondary | ICD-10-CM | POA: Diagnosis not present

## 2021-06-13 DIAGNOSIS — L578 Other skin changes due to chronic exposure to nonionizing radiation: Secondary | ICD-10-CM | POA: Diagnosis not present

## 2021-06-13 DIAGNOSIS — D225 Melanocytic nevi of trunk: Secondary | ICD-10-CM | POA: Diagnosis not present

## 2021-08-09 DIAGNOSIS — N9489 Other specified conditions associated with female genital organs and menstrual cycle: Secondary | ICD-10-CM | POA: Diagnosis not present

## 2021-08-22 ENCOUNTER — Other Ambulatory Visit: Payer: Self-pay

## 2021-08-22 ENCOUNTER — Ambulatory Visit
Admission: RE | Admit: 2021-08-22 | Discharge: 2021-08-22 | Disposition: A | Discharge status: Home / Self Care | Payer: BC Managed Care – PPO | Source: Ambulatory Visit

## 2021-08-22 VITALS — BP 137/83 | HR 88 | Temp 98.2°F | Resp 18 | Ht 69.0 in | Wt 178.0 lb

## 2021-08-22 DIAGNOSIS — M62831 Muscle spasm of calf: Secondary | ICD-10-CM | POA: Diagnosis not present

## 2021-08-22 MED ORDER — BACLOFEN 10 MG PO TABS: 10.0000 mg | ORAL_TABLET | Freq: Three times a day (TID) | ORAL | 0 refills | Status: AC | PRN

## 2021-08-22 NOTE — Discharge Instructions (Signed)
-  I have no concern for clot in your leg.  There is no swelling, skin color change, increased warmth of the leg or most importantly pain. -Your exam is consistent with muscle twitch and spasm.  Make sure you are getting enough fluids and electrolytes.  Try drinking Pedialyte or Gatorade.  You can also try to massage the area and apply heat which can relax the muscle. -I have printed a prescription for a muscle relaxer if this is still going on tomorrow.  You can give it a try.  I think it can be helpful. -If you do develop pain in the calf or swelling, fever, redness or red streak up the leg, you should be seen again immediately and reevaluated.  Also if you develop multiple other areas of spasms you should be seen again.

## 2021-08-22 NOTE — ED Provider Notes (Signed)
[facility]    CSN: [id number] Arrival date & time: 08/22/21  1254      History   Chief Complaint Chief Complaint  Patient presents with   Leg Problem    Left lower    HPI Story Conti is a 40 y.o. female presenting for a twitch in her calf for the past 2 days.  Patient says it comes and goes but it has been pretty much all day yesterday and today.  She denies any associated pain, swelling, skin color changes, increased warmth of skin.  Denies similar problems in the past and no areas of twitching anywhere else.  Patient says she does sit quite a bit for work.  She has not taken any meds for symptoms.  Patient is a little concerned about possible blood clot in her leg.  She denies any history of blood clot in her leg but has had cellulitis in her leg before.  Patient says she has been drinking plenty of water.  Does not report any recent changes to her activity.  Medical history significant for hypertension and hyperlipidemia.  No other complaints.  HPI  Past Medical History:  Diagnosis Date   Allergy    environmental   Cellulitis    Endometrioma    Hyperlipidemia 01/02/2020   Hypertension     Patient Active Problem List   Diagnosis Date Noted   Vitamin D deficiency 01/02/2020   Hyperlipidemia 01/02/2020   Hemorrhoid 01/02/2020   History of gestational hypertension 05/15/2017   Postpartum hypertension 05/15/2017   Mastitis 05/15/2017   History of 2 cesarean sections 02/18/2017   Obesity 10/16/2014   Abnormal Pap smear of cervix, ASCUS-09/2002 10/16/2014    Past Surgical History:  Procedure Laterality Date   CESAREAN SECTION     CESAREAN SECTION N/A 04/03/2017   Procedure: REPEAT CESAREAN SECTION;  Surgeon: Lazaro Arms, MD;  Location: [facility];  Service: Obstetrics;  Laterality: N/A;   EXCISION OF ENDOMETRIOMA  12/26/2015   Procedure: RESECTION OF PERIUMBILICAL ENDOMETRIOMA;  Surgeon: Lazaro Arms, MD;  Location: [facility];  Service: Gynecology;;    WISDOM TOOTH EXTRACTION      OB History     Gravida  2   Para  2   Term  2   Preterm      AB      Living  2      SAB      IAB      Ectopic      Multiple  0   Live Births  2        Obstetric Comments  IVF pregnancy,incision abscess          Home Medications    Prior to Admission medications   Medication Sig Start Date End Date Taking? Authorizing Provider  Ascorbic Acid (VITAMIN C) 1000 MG tablet Take 1,000 mg by mouth daily.   Yes [provider]  baclofen (LIORESAL) 10 MG tablet Take 1 tablet (10 mg total) by mouth 3 (three) times daily as needed for up to 5 days for muscle spasms. 08/22/21 08/27/21 Yes Shirlee Latch, PA-C  Cholecalciferol 125 MCG (5000 UT) capsule 10,000 Units daily.    Yes [provider]  fexofenadine (ALLEGRA) 60 MG tablet Take 60 mg by mouth daily.   Yes [provider]  meloxicam (MOBIC) 15 MG tablet Take 15 mg by mouth daily. 08/12/21  Yes [provider]  Multiple Vitamins-Minerals (MULTIPLE VITAMINS/WOMENS PO) daily.    Yes  [provider]  VOLNEA 0.15-0.02/0.01 MG (21/5) tablet TAKE 1 TABLET BY MOUTH EVERY DAY (TAKE ACTIVE PILLS CONTINUOUSLY., ONLY SKIP 1 WEEK EVERY 3 MONTHS) 02/12/21  Yes Lazaro Arms, MD  hydrocortisone (ANUSOL-HC) 2.5 % rectal cream Place 1 application rectally 2 (two) times daily. Patient not taking: Reported on 11/07/2020 01/31/20   Lazaro Arms, MD  hydrocortisone (ANUSOL-HC) 25 MG suppository Place 1 suppository (25 mg total) rectally 2 (two) times daily. 11/07/20   Pasty Spillers, MD    Family History Family History  Problem Relation Age of Onset   Hypertension Mother    Thyroid disease Mother    Breast cancer Mother    Hypertension Father    Rheum arthritis Maternal Grandfather    Stroke Paternal Grandmother    Alcohol abuse Paternal Grandfather    Rheum arthritis Cousin     Social History Social History   Tobacco Use   Smoking status:  Never   Smokeless tobacco: Never  Vaping Use   Vaping Use: Never used  Substance Use Topics   Alcohol use: Yes    Alcohol/week: 0.0 standard drinks    Comment: occassional   Drug use: No     Allergies   Penicillins   Review of Systems Review of Systems  Constitutional:  Negative for fatigue and fever.  Musculoskeletal:  Negative for arthralgias, gait problem, joint swelling and myalgias.       Twitching posterior left calf  Skin:  Negative for color change and rash.  Neurological:  Negative for weakness and numbness.    Physical Exam Triage Vital Signs ED Triage Vitals  Enc Vitals Group     BP 08/22/21 1309 137/83     Pulse Rate 08/22/21 1309 88     Resp 08/22/21 1309 18     Temp 08/22/21 1309 98.2 F (36.8 C)     Temp Source 08/22/21 1309 Oral     SpO2 08/22/21 1309 97 %     Weight 08/22/21 1307 178 lb (80.7 kg)     Height 08/22/21 1307 5\' 9"  (1.753 m)     Head Circumference --      Peak Flow --      Pain Score 08/22/21 1306 0     Pain Loc --      Pain Edu? --      Excl. in GC? --    No data found.  Updated Vital Signs BP 137/83 (BP Location: Left Arm)   Pulse 88   Temp 98.2 F (36.8 C) (Oral)   Resp 18   Ht 5\' 9"  (1.753 m)   Wt 178 lb (80.7 kg)   LMP 08/14/2021   SpO2 97%   BMI 26.29 kg/m     Physical Exam Vitals and nursing note reviewed.  Constitutional:      General: She is not in acute distress.    Appearance: Normal appearance. She is not ill-appearing or toxic-appearing.  HENT:     Head: Normocephalic and atraumatic.  Eyes:     General: No scleral icterus.       Right eye: No discharge.        Left eye: No discharge.     Conjunctiva/sclera: Conjunctivae normal.  Cardiovascular:     Rate and Rhythm: Normal rate and regular rhythm.     Heart sounds: Normal heart sounds.  Pulmonary:     Effort: Pulmonary effort is normal. No respiratory distress.     Breath sounds: Normal breath sounds.  Musculoskeletal:  Cervical back: Neck  supple.     Right lower leg: No edema.     Left lower leg: No edema.     Comments: LEFT CALF: There is minor twitching of muscles of posterior calf noted/visible. No TTP in this area and no swelling, erythema, varicose veins, visible veins, increased warmth. Full ROM of joint. Good strength.  Skin:    General: Skin is dry.  Neurological:     General: No focal deficit present.     Mental Status: She is alert. Mental status is at baseline.     Motor: No weakness.     Gait: Gait normal.  Psychiatric:        Mood and Affect: Mood normal.        Behavior: Behavior normal.        Thought Content: Thought content normal.     UC Treatments / Results  Labs (all labs ordered are listed, but only abnormal results are displayed) Labs Reviewed - No data to display  EKG   Radiology No results found.  Procedures Procedures (including critical care time)  Medications Ordered in UC Medications - No data to display  Initial Impression / Assessment and Plan / UC Course  I have reviewed the triage vital signs and the nursing notes.  Pertinent labs & imaging results that were available during my care of the patient were reviewed by me and considered in my medical decision making (see chart for details).  40 year old female presenting for twitching of the left posterior calf x2 days.  Denies any associated red flag signs or symptoms.  No swelling, pain, numbness, weakness, redness, warmth.  No fevers reported.  No similar problems in the past.  No other areas of twitching.  No history of DVT but has concerns.  Vitals normal and stable and she is overall well-appearing.  She does have a minor twitch of the left posterior calf but no other abnormal findings.  Consistent with muscle spasm.  Advised patient to increase fluids and electrolytes, apply heat to the area, stretch and massage the area.  I have also printed her prescription for baclofen to try if she desires.  Reviewed return and ED  precautions.  Final Clinical Impressions(s) / UC Diagnoses   Final diagnoses:  Muscle spasm of left calf     Discharge Instructions      -I have no concern for clot in your leg.  There is no swelling, skin color change, increased warmth of the leg or most importantly pain. -Your exam is consistent with muscle twitch and spasm.  Make sure you are getting enough fluids and electrolytes.  Try drinking Pedialyte or Gatorade.  You can also try to massage the area and apply heat which can relax the muscle. -I have printed a prescription for a muscle relaxer if this is still going on tomorrow.  You can give it a try.  I think it can be helpful. -If you do develop pain in the calf or swelling, fever, redness or red streak up the leg, you should be seen again immediately and reevaluated.  Also if you develop multiple other areas of spasms you should be seen again.     ED Prescriptions     Medication Sig Dispense Auth. Provider   baclofen (LIORESAL) 10 MG tablet Take 1 tablet (10 mg total) by mouth 3 (three) times daily as needed for up to 5 days for muscle spasms. 15 each Gareth Morgan      PDMP  not reviewed this encounter.   Shirlee Latch, PA-C 08/22/21 1354

## 2021-08-22 NOTE — ED Triage Notes (Signed)
Pt c/o feeling a pulsating sensation in her left lower leg for several days. Pt states she can see a vein pulsing near her calf. Pt denies any leg cramps, pain, redness or warmth to the site.

## 2021-09-17 ENCOUNTER — Other Ambulatory Visit: Payer: Self-pay | Admitting: Gerontology

## 2021-09-17 DIAGNOSIS — Z1321 Encounter for screening for nutritional disorder: Secondary | ICD-10-CM | POA: Diagnosis not present

## 2021-09-17 DIAGNOSIS — Z0000 Encounter for general adult medical examination without abnormal findings: Secondary | ICD-10-CM | POA: Diagnosis not present

## 2021-09-17 DIAGNOSIS — Z1231 Encounter for screening mammogram for malignant neoplasm of breast: Secondary | ICD-10-CM

## 2021-09-17 DIAGNOSIS — E559 Vitamin D deficiency, unspecified: Secondary | ICD-10-CM | POA: Diagnosis not present

## 2021-09-17 DIAGNOSIS — R6882 Decreased libido: Secondary | ICD-10-CM | POA: Diagnosis not present

## 2021-09-17 DIAGNOSIS — Z131 Encounter for screening for diabetes mellitus: Secondary | ICD-10-CM | POA: Diagnosis not present

## 2021-09-17 DIAGNOSIS — E785 Hyperlipidemia, unspecified: Secondary | ICD-10-CM | POA: Diagnosis not present

## 2021-09-17 DIAGNOSIS — M19071 Primary osteoarthritis, right ankle and foot: Secondary | ICD-10-CM | POA: Diagnosis not present

## 2021-09-17 DIAGNOSIS — Z1329 Encounter for screening for other suspected endocrine disorder: Secondary | ICD-10-CM | POA: Diagnosis not present

## 2021-11-14 ENCOUNTER — Ambulatory Visit
Admission: RE | Admit: 2021-11-14 | Discharge: 2021-11-14 | Disposition: A | Discharge status: Home / Self Care | Payer: BC Managed Care – PPO | Source: Ambulatory Visit | Attending: Gerontology | Admitting: Gerontology

## 2021-11-14 ENCOUNTER — Other Ambulatory Visit: Payer: Self-pay

## 2021-11-14 DIAGNOSIS — Z1231 Encounter for screening mammogram for malignant neoplasm of breast: Secondary | ICD-10-CM

## 2021-11-20 ENCOUNTER — Other Ambulatory Visit: Payer: Self-pay | Admitting: *Deleted

## 2021-11-20 MED ORDER — DESOGESTREL-ETHINYL ESTRADIOL 0.15-0.02/0.01 MG (21/5) PO TABS: ORAL_TABLET | ORAL | 3 refills | Status: DC

## 2021-11-21 ENCOUNTER — Other Ambulatory Visit: Payer: Self-pay | Admitting: Obstetrics & Gynecology

## 2021-11-21 ENCOUNTER — Other Ambulatory Visit: Payer: Self-pay

## 2021-11-21 MED ORDER — DESOGESTREL-ETHINYL ESTRADIOL 0.15-0.02/0.01 MG (21/5) PO TABS: ORAL_TABLET | ORAL | 3 refills | Status: DC

## 2022-07-03 ENCOUNTER — Telehealth: Payer: Self-pay | Admitting: Obstetrics & Gynecology

## 2022-07-03 NOTE — Telephone Encounter (Signed)
Spoke with patient and advised not stopping pills mid pack. She will continue taking them and if this happens again with her next 3 months she will let us know and make appointment with Dr. Despina Hidden.

## 2022-07-03 NOTE — Telephone Encounter (Signed)
Pt is having break through bleeding and is looking for advice. Please call.

## 2022-08-31 ENCOUNTER — Other Ambulatory Visit: Payer: Self-pay | Admitting: Obstetrics & Gynecology

## 2022-09-18 ENCOUNTER — Other Ambulatory Visit: Payer: Self-pay

## 2022-09-18 DIAGNOSIS — Z1231 Encounter for screening mammogram for malignant neoplasm of breast: Secondary | ICD-10-CM

## 2022-11-18 ENCOUNTER — Ambulatory Visit
Admission: RE | Admit: 2022-11-18 | Discharge: 2022-11-18 | Disposition: A | Discharge status: Home / Self Care | Payer: BC Managed Care – PPO | Source: Ambulatory Visit | Attending: Gerontology | Admitting: Gerontology

## 2022-11-18 DIAGNOSIS — Z1231 Encounter for screening mammogram for malignant neoplasm of breast: Secondary | ICD-10-CM | POA: Diagnosis not present

## 2023-02-03 ENCOUNTER — Other Ambulatory Visit (HOSPITAL_COMMUNITY)
Admission: RE | Admit: 2023-02-03 | Discharge: 2023-02-03 | Disposition: A | Discharge status: Home / Self Care | Payer: BC Managed Care – PPO | Source: Ambulatory Visit | Attending: Obstetrics & Gynecology | Admitting: Obstetrics & Gynecology

## 2023-02-03 ENCOUNTER — Ambulatory Visit (INDEPENDENT_AMBULATORY_CARE_PROVIDER_SITE_OTHER): Payer: BC Managed Care – PPO | Admitting: Obstetrics & Gynecology

## 2023-02-03 ENCOUNTER — Encounter: Payer: Self-pay | Admitting: Obstetrics & Gynecology

## 2023-02-03 VITALS — BP 136/81 | HR 84 | Ht 69.0 in | Wt 195.0 lb

## 2023-02-03 DIAGNOSIS — Z01419 Encounter for gynecological examination (general) (routine) without abnormal findings: Secondary | ICD-10-CM

## 2023-02-03 MED ORDER — DESOGESTREL-ETHINYL ESTRADIOL 0.15-0.02/0.01 MG (21/5) PO TABS: ORAL_TABLET | ORAL | 3 refills | Status: DC

## 2023-02-03 NOTE — Progress Notes (Signed)
Subjective:     Sonya Chapman is a 42 y.o. female here for a routine exam.  Patient's last menstrual period was 12/17/2022. [id number] Birth Control Method:  COC Menstrual Calendar(currently): regular, light  Current complaints: random episodic periumbilical discomfort, same with low back, exam negative.   Current acute medical issues:     Recent Gynecologic History Patient's last menstrual period was 12/17/2022. Last Pap: 01/31/2020,  normal Last mammogram: 11/18/22,  normal  Past Medical History:  Diagnosis Date   Allergy    environmental   Cellulitis    Endometrioma    Hyperlipidemia 01/02/2020   Hypertension     Past Surgical History:  Procedure Laterality Date   CESAREAN SECTION     CESAREAN SECTION N/A 04/03/2017   Procedure: REPEAT CESAREAN SECTION;  Surgeon: Florian Buff, MD;  Location: [facility];  Service: Obstetrics;  Laterality: N/A;   EXCISION OF ENDOMETRIOMA  12/26/2015   Procedure: RESECTION OF PERIUMBILICAL ENDOMETRIOMA;  Surgeon: Florian Buff, MD;  Location: [facility];  Service: Gynecology;;   WISDOM TOOTH EXTRACTION      OB History     Gravida  2   Para  2   Term  2   Preterm      AB      Living  2      SAB      IAB      Ectopic      Multiple  0   Live Births  2        Obstetric Comments  IVF pregnancy,incision abscess         Social History   Socioeconomic History   Marital status: Married    Spouse name: Jaci Standard   Number of children: 2   Years of education: 16   Highest education level: Not on file  Occupational History   Occupation: Marine scientist  Tobacco Use   Smoking status: Never   Smokeless tobacco: Never  Vaping Use   Vaping Use: Never used  Substance and Sexual Activity   Alcohol use: Yes    Alcohol/week: 0.0 standard drinks of alcohol    Comment: occassional   Drug use: No   Sexual activity: Yes    Birth control/protection: None, Pill  Other Topics Concern   Not on file  Social History  Narrative   Bachelors degree   Husband Preston,married 16 years.   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   Social Determinants of Health   Financial Resource Strain: Low Risk  (02/03/2023)   Overall Financial Resource Strain (CARDIA)    Difficulty of Paying Living Expenses: Not hard at all  Food Insecurity: No Food Insecurity (02/03/2023)   Hunger Vital Sign    Worried About Running Out of Food in the Last Year: Never true    Ran Out of Food in the Last Year: Never true  Transportation Needs: No Transportation Needs (02/03/2023)   PRAPARE - Hydrologist (Medical): No    Lack of Transportation (Non-Medical): No  Physical Activity: Insufficiently Active (02/03/2023)   Exercise Vital Sign    Days of Exercise per Week: 4 days    Minutes of Exercise per Session: 30 min  Stress: No Stress Concern Present (02/03/2023)   [place]    Feeling of Stress : Only a little  Social Connections: Socially Integrated (02/03/2023)   Social Connection and Isolation Panel [NHANES]  Frequency of Communication with Friends and Family: More than three times a week    Frequency of Social Gatherings with Friends and Family: Once a week    Attends Religious Services: More than 4 times per year    Active Member of [employer] or Organizations: Yes    Attends Music therapist: More than 4 times per year    Marital Status: Married    Family History  Problem Relation Age of Onset   Alcohol abuse Paternal Grandfather    Dementia Paternal Grandmother    Stroke Paternal Grandmother    Hypertension Maternal Grandmother    Rheum arthritis Maternal Grandfather    Hypertension Mother    Thyroid disease Mother    Breast cancer Mother    Rheum arthritis Cousin      Current Outpatient Medications:    Ascorbic Acid (VITAMIN C) 1000 MG tablet, Take 1,000 mg by mouth daily., Disp: , Rfl:    buPROPion  (WELLBUTRIN XL) 150 MG 24 hr tablet, Take 150 mg by mouth every morning., Disp: , Rfl:    Cholecalciferol 50 MCG (2000 UT) CAPS, Take by mouth., Disp: , Rfl:    fexofenadine (ALLEGRA) 60 MG tablet, Take 60 mg by mouth daily., Disp: , Rfl:    fluticasone (FLONASE) 50 MCG/ACT nasal spray, Place into the nose., Disp: , Rfl:    meloxicam (MOBIC) 15 MG tablet, Take 15 mg by mouth daily., Disp: , Rfl:    Multiple Vitamins-Minerals (MULTIPLE VITAMINS/WOMENS PO), daily. , Disp: , Rfl:    desogestrel-ethinyl estradiol (MIRCETTE) 0.15-0.02/0.01 MG (21/5) tablet, Take 1 tablet by mouth every day. Take active pills continuously, only skip 1 week every 3 months., Disp: 84 tablet, Rfl: 3  Review of Systems  Review of Systems  Constitutional: Negative for fever, chills, weight loss, malaise/fatigue and diaphoresis.  HENT: Negative for hearing loss, ear pain, nosebleeds, congestion, sore throat, neck pain, tinnitus and ear discharge.   Eyes: Negative for blurred vision, double vision, photophobia, pain, discharge and redness.  Respiratory: Negative for cough, hemoptysis, sputum production, shortness of breath, wheezing and stridor.   Cardiovascular: Negative for chest pain, palpitations, orthopnea, claudication, leg swelling and PND.  Gastrointestinal: negative for abdominal pain. Negative for heartburn, nausea, vomiting, diarrhea, constipation, blood in stool and melena.  Genitourinary: Negative for dysuria, urgency, frequency, hematuria and flank pain.  Musculoskeletal: Negative for myalgias, back pain, joint pain and falls.  Skin: Negative for itching and rash.  Neurological: Negative for dizziness, tingling, tremors, sensory change, speech change, focal weakness, seizures, loss of consciousness, weakness and headaches.  Endo/Heme/Allergies: Negative for environmental allergies and polydipsia. Does not bruise/bleed easily.  Psychiatric/Behavioral: Negative for depression, suicidal ideas, hallucinations,  memory loss and substance abuse. The patient is not nervous/anxious and does not have insomnia.        Objective:  Blood pressure 136/81, pulse 84, height 5\' 9"  (1.753 m), weight 195 lb (88.5 kg), last menstrual period 12/17/2022.   Physical Exam  Vitals reviewed. Constitutional: She is oriented to person, place, and time. She appears well-developed and well-nourished.  HENT:  Head: Normocephalic and atraumatic.        Right Ear: External ear normal.  Left Ear: External ear normal.  Nose: Nose normal.  Mouth/Throat: Oropharynx is clear and moist.  Eyes: Conjunctivae and EOM are normal. Pupils are equal, round, and reactive to light. Right eye exhibits no discharge. Left eye exhibits no discharge. No scleral icterus.  Neck: Normal range of motion. Neck supple. No tracheal deviation  present. No thyromegaly present.  Cardiovascular: Normal rate, regular rhythm, normal heart sounds and intact distal pulses.  Exam reveals no gallop and no friction rub.   No murmur heard. Respiratory: Effort normal and breath sounds normal. No respiratory distress. She has no wheezes. She has no rales. She exhibits no tenderness.  GI: Soft. Bowel sounds are normal. She exhibits no distension and no mass. There is no tenderness. There is no rebound and no guarding. Umbilicus feels normal no hernia or masses palpable Genitourinary:  Breasts no masses skin changes or nipple changes bilaterally      Vulva is normal without lesions Vagina is pink moist without discharge Cervix normal in appearance and pap is done Uterus is normal size shape and contour Adnexa is negative with normal sized ovaries   Musculoskeletal: Normal range of motion. She exhibits no edema and no tenderness.  Neurological: She is alert and oriented to person, place, and time. She has normal reflexes. She displays normal reflexes. No cranial nerve deficit. She exhibits normal muscle tone. Coordination normal.  Skin: Skin is warm and dry. No  rash noted. No erythema. No pallor.  Psychiatric: She has a normal mood and affect. Her behavior is normal. Judgment and thought content normal.       Medications Ordered at today's visit: Meds ordered this encounter  Medications   desogestrel-ethinyl estradiol (MIRCETTE) 0.15-0.02/0.01 MG (21/5) tablet    Sig: Take 1 tablet by mouth every day. Take active pills continuously, only skip 1 week every 3 months.    Dispense:  84 tablet    Refill:  3    Other orders placed at today's visit: No orders of the defined types were placed in this encounter.     Assessment:    Normal Gyn exam.   History of periumbilical endometrioma, relatively asymptomatic now, on COC Plan:    Contraception: OCP (estrogen/progesterone). Follow up in: 3 years.  prn   No follow-ups on file.

## 2023-02-09 LAB — CYTOLOGY - PAP
Comment: NEGATIVE
Diagnosis: NEGATIVE
High risk HPV: NEGATIVE

## 2023-09-24 ENCOUNTER — Other Ambulatory Visit: Payer: Self-pay | Admitting: Gerontology

## 2023-09-24 DIAGNOSIS — Z1231 Encounter for screening mammogram for malignant neoplasm of breast: Secondary | ICD-10-CM

## 2023-11-04 ENCOUNTER — Other Ambulatory Visit: Payer: Self-pay | Admitting: Obstetrics & Gynecology

## 2023-11-23 ENCOUNTER — Ambulatory Visit
Admission: RE | Admit: 2023-11-23 | Discharge: 2023-11-23 | Disposition: A | Discharge status: Home / Self Care | Payer: BC Managed Care – PPO | Source: Ambulatory Visit | Attending: Gerontology | Admitting: Gerontology

## 2023-11-23 DIAGNOSIS — Z1231 Encounter for screening mammogram for malignant neoplasm of breast: Secondary | ICD-10-CM | POA: Diagnosis present

## 2024-03-25 ENCOUNTER — Encounter: Payer: Self-pay | Admitting: Obstetrics & Gynecology

## 2024-03-25 MED ORDER — DESOGESTREL-ETHINYL ESTRADIOL 0.15-0.02/0.01 MG (21/5) PO TABS: ORAL_TABLET | ORAL | 2 refills | Status: DC

## 2024-07-19 ENCOUNTER — Other Ambulatory Visit: Payer: Self-pay | Admitting: Obstetrics & Gynecology

## 2024-10-07 ENCOUNTER — Other Ambulatory Visit: Payer: Self-pay | Admitting: Gerontology

## 2024-10-07 DIAGNOSIS — Z1231 Encounter for screening mammogram for malignant neoplasm of breast: Secondary | ICD-10-CM

## 2024-11-23 ENCOUNTER — Ambulatory Visit
Admission: RE | Admit: 2024-11-23 | Discharge: 2024-11-23 | Disposition: A | Discharge status: Home / Self Care | Source: Ambulatory Visit | Attending: Gerontology | Admitting: Gerontology

## 2024-11-23 DIAGNOSIS — Z1231 Encounter for screening mammogram for malignant neoplasm of breast: Secondary | ICD-10-CM | POA: Insufficient documentation
